# Patient Record
Sex: Female | Born: 1990 | Hispanic: No | Marital: Married | Smoking: Never smoker
Health system: Southern US, Community
[De-identification: ages and names within clinical notes are randomized; demographics above are authoritative.]

## PROBLEM LIST (undated history)

## (undated) DIAGNOSIS — M1990 Unspecified osteoarthritis, unspecified site: Secondary | ICD-10-CM

## (undated) DIAGNOSIS — D649 Anemia, unspecified: Secondary | ICD-10-CM

## (undated) DIAGNOSIS — E559 Vitamin D deficiency, unspecified: Secondary | ICD-10-CM

## (undated) HISTORY — PX: NO PAST SURGERIES: SHX2092

## (undated) HISTORY — DX: Vitamin D deficiency, unspecified: E55.9

---

## 2017-12-02 NOTE — L&D Delivery Note (Addendum)
Patient: Ashley Robles MRN: [id number]  GBS status: Negative  Patient is a 27 y.o. now G1P1 s/p NSVD at [redacted]w[redacted]d, who was admitted for SOL. SROM 19h 23m prior to delivery with clear fluid.    Delivery Note At 2:50 AM a viable female was delivered via Vaginal, Vacuum (Extractor) (Presentation: OA ).  APGAR: 7, 8; weight  pending.   Placenta status: spontaneous, intact with trailing membranes removed with ring forceps.  Cord: 3 vessel with the following complications: none.    Dilation complete at 22:30. Patient pushed for over 2 hours with breaks. Due to maternal fatigue and persistent fetal tachycardia with periods of fair variability and no reactivity, Dr. Alysia Penna was consulted for VAVD. Patient counseled on risks and benefits of use of vacuum extractor for delivery. Kiwi was applied. Head delivered OA with two ctx and 1 pop off. Median episiotomy cut to assist with delivery. No nuchal cord present. Shoulder and body delivered in usual fashion. Infant with spontaneous cry, placed on mother's abdomen, dried and bulb suctioned. Cord clamped x 2 after 1-minute delay, and cut by family member. Infant handed over to NICU team for evaluation. Cord blood drawn. Placenta delivered spontaneously with gentle cord traction. Trailing membranes removed with ring forcep. Fundus firm with massage and Pitocin. Perineum inspected and found to have 2nd degree laceration, which was repaired with 3.0 vicryl and 3.0 monocryl with good hemostasis achieved.  Anesthesia:  Epidural, Lidocaine 25 cc for repair  Episiotomy: Median Lacerations:  2nd degree perineal  Suture Repair: 3.0 vicryl 3.0 monocryl  Est. Blood Loss (mL): 350  Mom to postpartum.  Baby to Couplet care / Skin to Skin.  De Hollingshead 08/09/2018, 3:38 AM  OB Attending I was present and in the room during the delivery and repair. As noted above.  Nettie Elm, MD

## 2018-01-27 ENCOUNTER — Encounter: Payer: Self-pay | Admitting: Certified Nurse Midwife

## 2018-01-27 ENCOUNTER — Ambulatory Visit (INDEPENDENT_AMBULATORY_CARE_PROVIDER_SITE_OTHER): Payer: Medicaid Other | Admitting: Certified Nurse Midwife

## 2018-01-27 ENCOUNTER — Other Ambulatory Visit (HOSPITAL_COMMUNITY)
Admission: RE | Admit: 2018-01-27 | Discharge: 2018-01-27 | Disposition: A | Discharge status: Home / Self Care | Payer: Medicaid Other | Source: Ambulatory Visit | Attending: Certified Nurse Midwife | Admitting: Certified Nurse Midwife

## 2018-01-27 VITALS — BP 112/66 | HR 87 | Ht 63.0 in | Wt 125.0 lb

## 2018-01-27 DIAGNOSIS — O219 Vomiting of pregnancy, unspecified: Secondary | ICD-10-CM

## 2018-01-27 DIAGNOSIS — Z3401 Encounter for supervision of normal first pregnancy, first trimester: Secondary | ICD-10-CM | POA: Diagnosis present

## 2018-01-27 DIAGNOSIS — Z3400 Encounter for supervision of normal first pregnancy, unspecified trimester: Secondary | ICD-10-CM | POA: Insufficient documentation

## 2018-01-27 MED ORDER — DOXYLAMINE-PYRIDOXINE 10-10 MG PO TBEC: DELAYED_RELEASE_TABLET | ORAL | 4 refills | Status: AC

## 2018-01-27 MED ORDER — VITAFOL-NANO 18-0.6-0.4 MG PO TABS: 1.0000 | ORAL_TABLET | Freq: Every day | ORAL | 12 refills | Status: AC

## 2018-01-27 NOTE — Progress Notes (Signed)
Subjective:   Ashley Robles is a 27 y.o. G1P0 at [redacted]w[redacted]d by LMP being seen today for her first obstetrical visit.  Her obstetrical history is significant for none; here from [place] since November with spouse. Patient does intend to breast feed. Pregnancy history fully reviewed.  Patient reports nausea, no bleeding, no contractions, no cramping and no leaking.  HISTORY: Obstetric History   G1   P0   T0   P0   A0   L0    SAB0   TAB0   Ectopic0   Multiple0   Live Births0     # Outcome Date GA Lbr Len/2nd Weight Sex Delivery Anes PTL Lv  1 Current               Last pap smear was done unknown, patient denies any past pap smears.   Past Medical History:  Diagnosis Date  . Vitamin D deficiency    History reviewed. No pertinent surgical history. Family History  Problem Relation Age of Onset  . Diabetes Mother   . Thyroid disease Mother   . Diabetes Father   . Alzheimer's disease Maternal Grandfather    Social History   Tobacco Use  . Smoking status: Never Smoker  . Smokeless tobacco: Never Used  Substance Use Topics  . Alcohol use: No    Frequency: Never  . Drug use: No   Not on File Current Outpatient Medications on File Prior to Visit  Medication Sig Dispense Refill  . ondansetron (ZOFRAN) 4 MG tablet Take 4 mg by mouth every 8 (eight) hours as needed for nausea or vomiting.    . prenatal vitamin w/FE, FA (PRENATAL 1 + 1) 27-1 MG TABS tablet Take 1 tablet by mouth daily at 12 noon.     No current facility-administered medications on file prior to visit.     Review of Systems Pertinent items noted in HPI and remainder of comprehensive ROS otherwise negative.  Exam   Vitals:   01/27/18 1037 01/27/18 1041  BP: 112/66   Pulse: 87   Weight: 125 lb (56.7 kg)   Height:  5\' 3"  (1.6 m)      Uterus:     Pelvic Exam: Perineum: no hemorrhoids, normal perineum   Vulva: normal external genitalia, no lesions   Vagina:  normal mucosa, normal discharge   Cervix: no lesions and normal, pap smear done.    Adnexa: normal adnexa and no mass, fullness, tenderness   Bony Pelvis: average  System: General: well-developed, well-nourished female in no acute distress   Breast:  normal appearance, no masses or tenderness   Skin: normal coloration and turgor, no rashes   Neurologic: oriented, normal, negative, normal mood   Extremities: normal strength, tone, and muscle mass, ROM of all joints is normal   HEENT PERRLA, extraocular movement intact and sclera clear, anicteric   Mouth/Teeth mucous membranes moist, pharynx normal without lesions and dental hygiene good   Neck supple and no masses   Cardiovascular: regular rate and rhythm   Respiratory:  no respiratory distress, normal breath sounds   Abdomen: soft, non-tender; bowel sounds normal; no masses,  no organomegaly     Assessment:   Pregnancy: G1P0 Patient Active Problem List   Diagnosis Date Noted  . Encounter for supervision of normal first pregnancy in first trimester 01/27/2018  . Nausea/vomiting in pregnancy 01/27/2018     Plan:  1. Encounter for supervision of normal first pregnancy in first trimester    -  Obstetric Panel, Including HIV - VITAMIN D 25 Hydroxy (Vit-D Deficiency, Fractures) - Culture, OB Urine - Hemoglobin A1c - Cytology - PAP - Cervicovaginal ancillary only - Inheritest Core(CF97,SMA,FraX) - Enroll Patient in Babyscripts - Genetic Screening - Prenatal-Fe Fum-Methf-FA w/o A (VITAFOL-NANO) 18-0.6-0.4 MG TABS; Take 1 tablet by mouth daily.  Dispense: 30 tablet; Refill: 12  2. Nausea/vomiting in pregnancy    - Doxylamine-Pyridoxine (DICLEGIS) 10-10 MG TBEC; Take 1 tablet with breakfast and lunch.  Take 2 tablets at bedtime.  Dispense: 100 tablet; Refill: 4   Initial labs drawn. Continue prenatal vitamins. Genetic Screening discussed, NIPS: ordered. Ultrasound discussed; fetal anatomic survey: ordered. Problem list reviewed and updated. The nature of [facility] with multiple MDs and other Advanced Practice Providers was explained to patient; also emphasized that residents, students are part of our team. Routine obstetric precautions reviewed. Return in about 4 weeks (around 02/24/2018) for ROB.     Orvilla Cornwallachelle Deunta Beneke, CNM [facility]

## 2018-01-27 NOTE — Progress Notes (Signed)
Pt complaints of constipation.   Pt is currently taking Zofran as needed for N&V- doing well.

## 2018-01-28 LAB — OBSTETRIC PANEL, INCLUDING HIV
Antibody Screen: NEGATIVE
BASOS ABS: 0 10*3/uL (ref 0.0–0.2)
Basos: 1 %
EOS (ABSOLUTE): 0.3 10*3/uL (ref 0.0–0.4)
Eos: 6 %
HEMATOCRIT: 31.9 % — AB (ref 34.0–46.6)
HIV Screen 4th Generation wRfx: NONREACTIVE
Hemoglobin: 10.8 g/dL — ABNORMAL LOW (ref 11.1–15.9)
Hepatitis B Surface Ag: NEGATIVE
IMMATURE GRANS (ABS): 0 10*3/uL (ref 0.0–0.1)
Immature Granulocytes: 0 %
LYMPHS: 29 %
Lymphocytes Absolute: 1.7 10*3/uL (ref 0.7–3.1)
MCH: 28.4 pg (ref 26.6–33.0)
MCHC: 33.9 g/dL (ref 31.5–35.7)
MCV: 84 fL (ref 79–97)
MONOCYTES: 8 %
Monocytes Absolute: 0.5 10*3/uL (ref 0.1–0.9)
NEUTROS PCT: 56 %
Neutrophils Absolute: 3.2 10*3/uL (ref 1.4–7.0)
PLATELETS: 262 10*3/uL (ref 150–379)
RBC: 3.8 x10E6/uL (ref 3.77–5.28)
RDW: 15.1 % (ref 12.3–15.4)
RPR Ser Ql: NONREACTIVE
RUBELLA: 2.93 {index} (ref >0.99)
Rh Factor: POSITIVE
WBC: 5.6 10*3/uL (ref 3.4–10.8)

## 2018-01-28 LAB — CERVICOVAGINAL ANCILLARY ONLY
BACTERIAL VAGINITIS: NEGATIVE
CANDIDA VAGINITIS: POSITIVE — AB
Chlamydia: NEGATIVE
Neisseria Gonorrhea: NEGATIVE
Trichomonas: NEGATIVE

## 2018-01-28 LAB — VITAMIN D 25 HYDROXY (VIT D DEFICIENCY, FRACTURES): VIT D 25 HYDROXY: 31.2 ng/mL (ref 30.0–100.0)

## 2018-01-28 LAB — HEMOGLOBIN A1C
Est. average glucose Bld gHb Est-mCnc: 103 mg/dL
HEMOGLOBIN A1C: 5.2 % (ref 4.8–5.6)

## 2018-01-29 ENCOUNTER — Telehealth: Payer: Self-pay

## 2018-01-29 ENCOUNTER — Other Ambulatory Visit: Payer: Self-pay | Admitting: Certified Nurse Midwife

## 2018-01-29 DIAGNOSIS — B3731 Acute candidiasis of vulva and vagina: Secondary | ICD-10-CM

## 2018-01-29 DIAGNOSIS — B373 Candidiasis of vulva and vagina: Secondary | ICD-10-CM

## 2018-01-29 DIAGNOSIS — O99011 Anemia complicating pregnancy, first trimester: Secondary | ICD-10-CM

## 2018-01-29 LAB — CYTOLOGY - PAP
Adequacy: ABSENT
Diagnosis: NEGATIVE

## 2018-01-29 LAB — CULTURE, OB URINE

## 2018-01-29 LAB — URINE CULTURE, OB REFLEX

## 2018-01-29 MED ORDER — CITRANATAL BLOOM 90-1 MG PO TABS: 1.0000 | ORAL_TABLET | Freq: Every day | ORAL | 12 refills | Status: DC

## 2018-01-29 MED ORDER — TERCONAZOLE 0.8 % VA CREA: 1.0000 | TOPICAL_CREAM | Freq: Every day | VAGINAL | 0 refills | Status: DC

## 2018-01-29 NOTE — Telephone Encounter (Signed)
Left VM message to call office.

## 2018-01-29 NOTE — Telephone Encounter (Signed)
Patient notified of results and Rx. She verbalized understanding.

## 2018-01-29 NOTE — Telephone Encounter (Signed)
-----   Message from Roe Coombsachelle A Denney, CNM sent at 01/29/2018 10:05 AM EST ----- Please let her know that she is anemic.  I have added bloom to her other PNV.  Thank you. R.Denney CNM  Please let her know that she has a yeast infection.  Terconazole vaginal cream was sent to the pharmacy for her to use.  Thank you.

## 2018-01-30 ENCOUNTER — Telehealth: Payer: Self-pay

## 2018-01-30 NOTE — Telephone Encounter (Signed)
PA#[id number]

## 2018-02-02 ENCOUNTER — Other Ambulatory Visit: Payer: Self-pay | Admitting: Certified Nurse Midwife

## 2018-02-02 DIAGNOSIS — Z3401 Encounter for supervision of normal first pregnancy, first trimester: Secondary | ICD-10-CM

## 2018-02-06 ENCOUNTER — Other Ambulatory Visit: Payer: Self-pay | Admitting: Certified Nurse Midwife

## 2018-02-06 DIAGNOSIS — Z3401 Encounter for supervision of normal first pregnancy, first trimester: Secondary | ICD-10-CM

## 2018-02-06 LAB — INHERITEST CORE(CF97,SMA,FRAX)

## 2018-02-10 ENCOUNTER — Other Ambulatory Visit: Payer: Self-pay | Admitting: Certified Nurse Midwife

## 2018-02-10 DIAGNOSIS — Z3401 Encounter for supervision of normal first pregnancy, first trimester: Secondary | ICD-10-CM

## 2018-02-24 ENCOUNTER — Ambulatory Visit (INDEPENDENT_AMBULATORY_CARE_PROVIDER_SITE_OTHER): Payer: Medicaid Other | Admitting: Certified Nurse Midwife

## 2018-02-24 ENCOUNTER — Encounter: Payer: Self-pay | Admitting: *Deleted

## 2018-02-24 VITALS — BP 95/60 | HR 82 | Wt 128.0 lb

## 2018-02-24 DIAGNOSIS — Z3401 Encounter for supervision of normal first pregnancy, first trimester: Secondary | ICD-10-CM

## 2018-02-24 NOTE — Progress Notes (Signed)
   PRENATAL VISIT NOTE  Subjective:  Ashley Robles is a 27 y.o. G1P0 at 1334w3d being seen today for ongoing prenatal care.  She is currently monitored for the following issues for this low-risk pregnancy and has Encounter for supervision of normal first pregnancy in first trimester and Nausea/vomiting in pregnancy on their problem list.  Patient reports headache, nausea, no bleeding, no contractions, no cramping and no leaking.  Contractions: Not present.  .   . Denies leaking of fluid.   The following portions of the patient's history were reviewed and updated as appropriate: allergies, current medications, past family history, past medical history, past social history, past surgical history and problem list. Problem list updated.  Objective:   Vitals:   02/24/18 1006  BP: 95/60  Pulse: 82  Weight: 128 lb (58.1 kg)    Fetal Status: Fetal Heart Rate (bpm): 150; doppler         General:  Alert, oriented and cooperative. Patient is in no acute distress.  Skin: Skin is warm and dry. No rash noted.   Right Breast Normal, no masses palpated, no nipple discharge  Cardiovascular: Normal heart rate noted  Respiratory: Normal respiratory effort, no problems with respiration noted  Abdomen: Soft, gravid, appropriate for gestational age.  Pain/Pressure: Absent     Pelvic: Cervical exam deferred        Extremities: Normal range of motion.     Mental Status:  Normal mood and affect. Normal behavior. Normal judgment and thought content.   Assessment and Plan:  Pregnancy: G1P0 at 4234w3d  1. Encounter for supervision of normal first pregnancy in first trimester     Nausea has improved.  Has not tried OTC Tylenol for her HA.  Patient told to go get a good bra fitting, current bra is not the correct size.   - US MFM OB COMP + 14 WK; Future  Preterm labor symptoms and general obstetric precautions including but not limited to vaginal bleeding, contractions, leaking of fluid and fetal movement were  reviewed in detail with the patient. Please refer to After Visit Summary for other counseling recommendations.  Return in about 1 month (around 03/24/2018) for ROB, MSAFP.   Roe Coombsachelle A Doreena Maulden, CNM

## 2018-03-27 ENCOUNTER — Other Ambulatory Visit: Payer: Self-pay | Admitting: Certified Nurse Midwife

## 2018-03-27 ENCOUNTER — Ambulatory Visit (HOSPITAL_COMMUNITY)
Admission: RE | Admit: 2018-03-27 | Discharge: 2018-03-27 | Disposition: A | Discharge status: Home / Self Care | Payer: Medicaid Other | Source: Ambulatory Visit | Attending: Certified Nurse Midwife | Admitting: Certified Nurse Midwife

## 2018-03-27 DIAGNOSIS — Z3A18 18 weeks gestation of pregnancy: Secondary | ICD-10-CM

## 2018-03-27 DIAGNOSIS — Z3401 Encounter for supervision of normal first pregnancy, first trimester: Secondary | ICD-10-CM

## 2018-03-27 DIAGNOSIS — Z36 Encounter for antenatal screening for chromosomal anomalies: Secondary | ICD-10-CM | POA: Insufficient documentation

## 2018-03-27 DIAGNOSIS — Z369 Encounter for antenatal screening, unspecified: Secondary | ICD-10-CM

## 2018-03-27 IMAGING — US US MFM OB COMP +14 WKS
1 series · 14 of 28 positions shown · non-contrast
Comparison: none

[Series 1: us mfm ob comp +14 wks · 73 acquisitions, 14 frames shown]
[im 3/73]
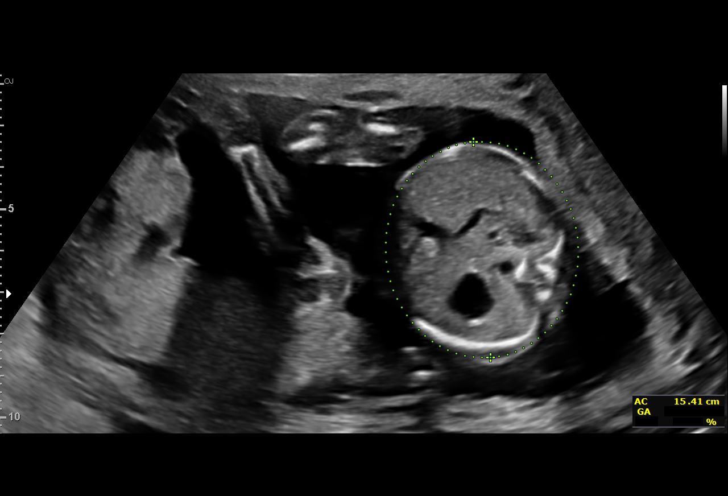
[im 9/73]
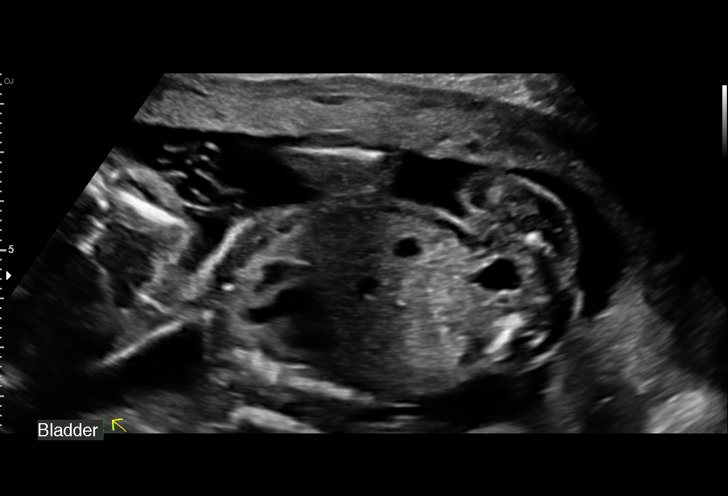
[im 14/73]
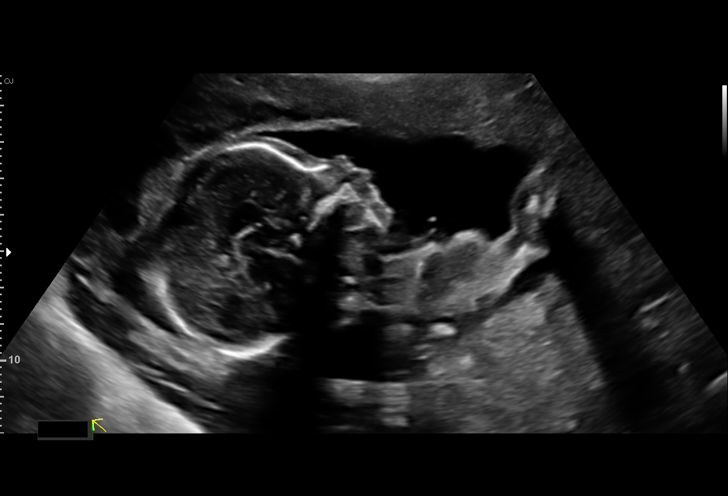
[im 19/73]
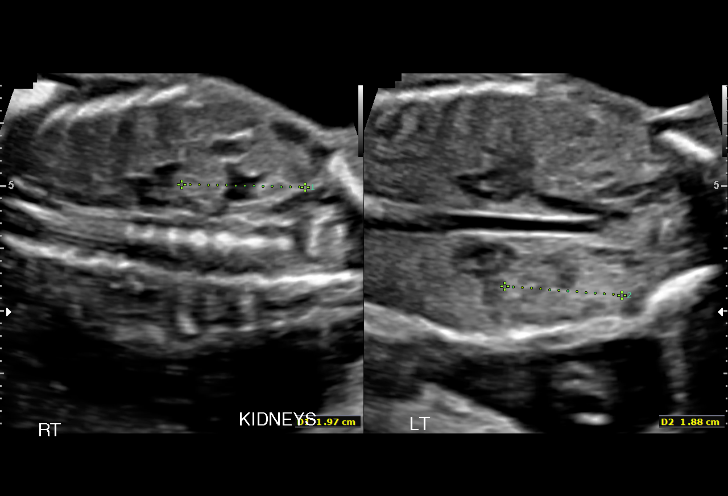
[im 25/73]
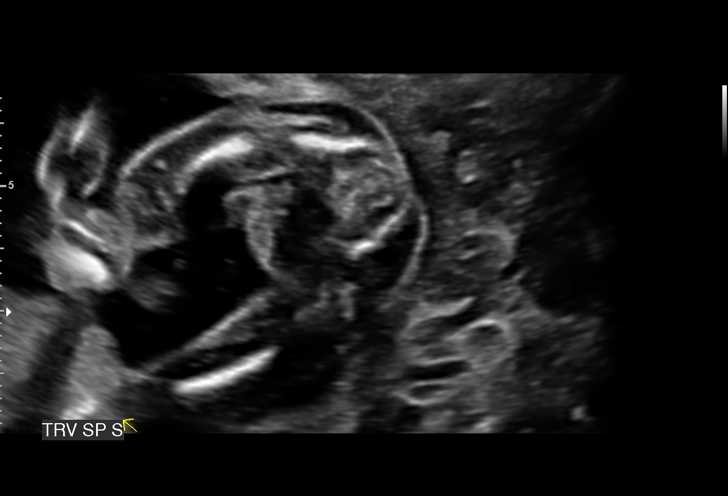
[im 30/73]
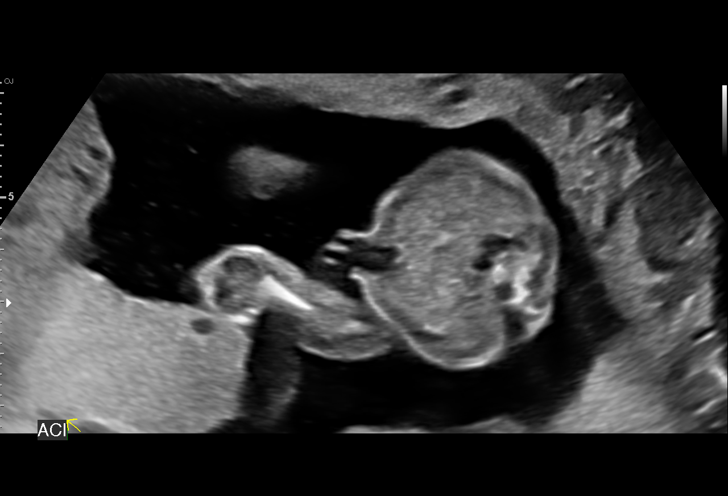
[im 35/73]
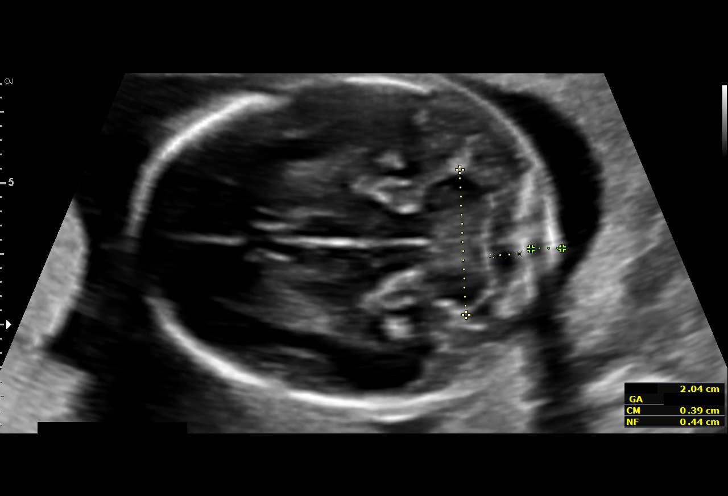
[im 41/73]
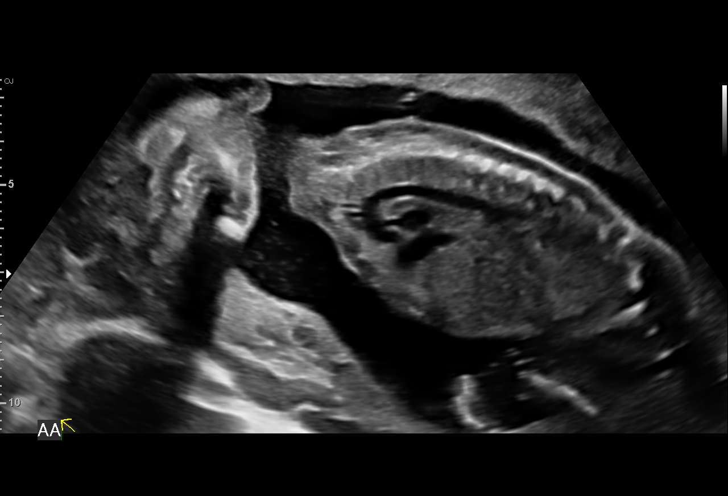
[im 46/73]
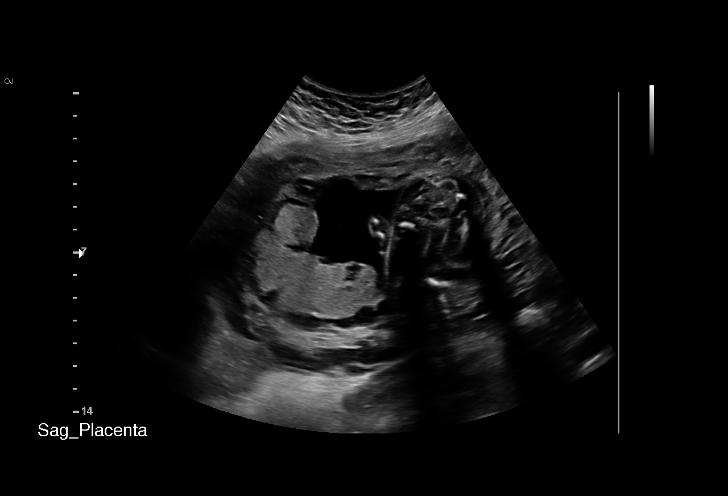
[im 51/73]
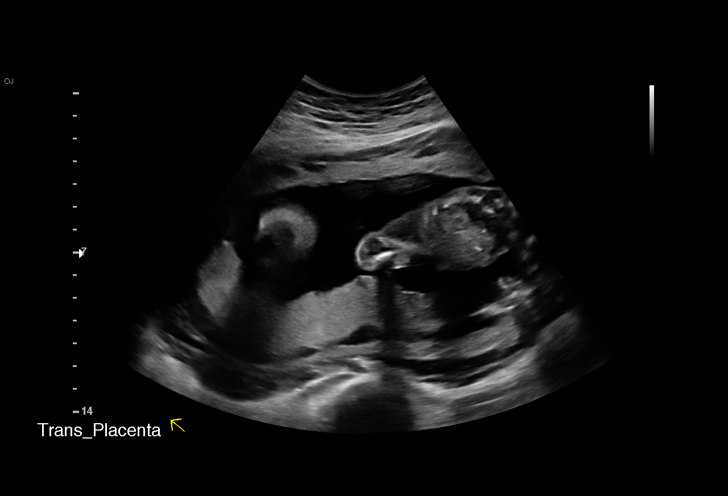
[im 57/73]
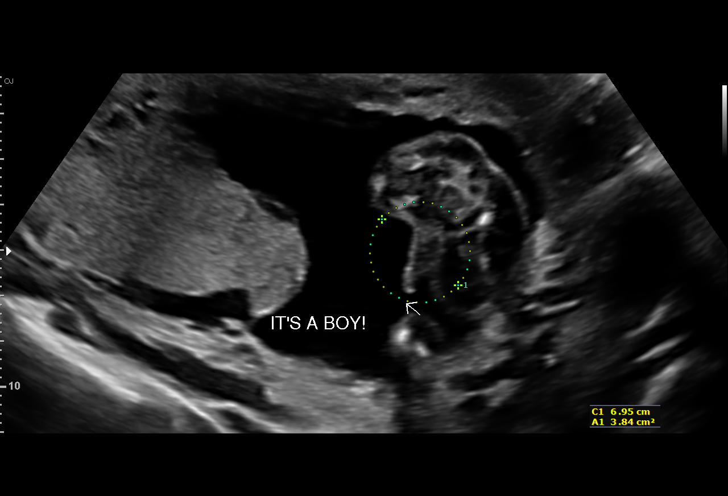
[im 62/73]
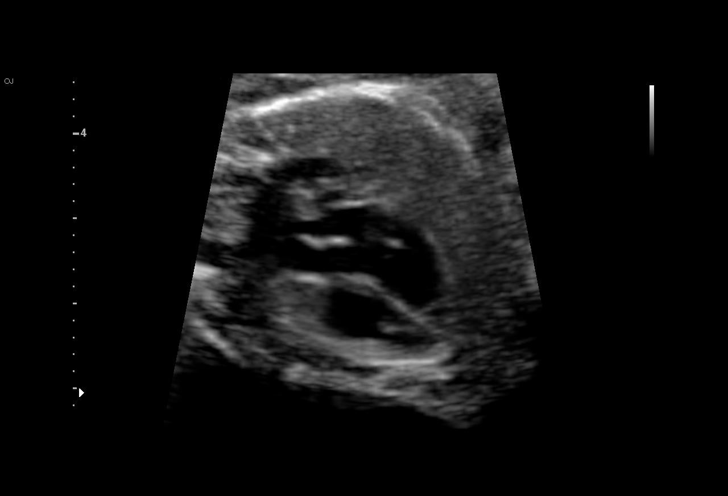
[im 67/73]
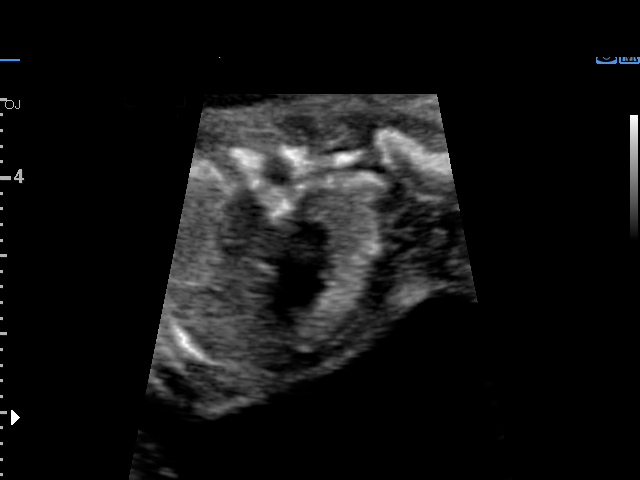
[im 73/73]
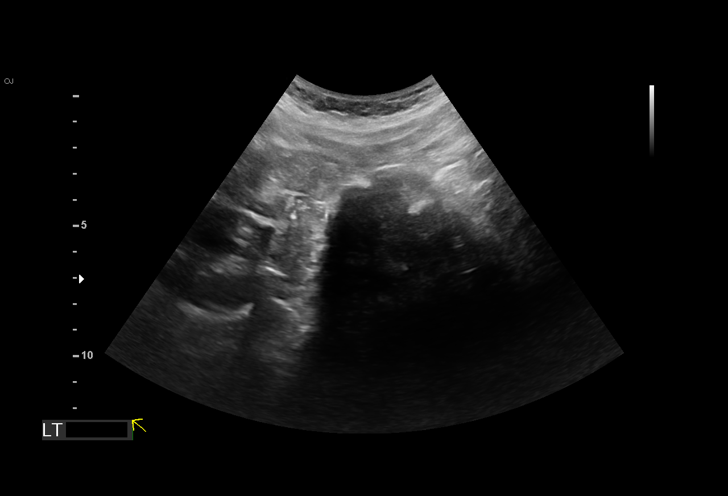

[14 of 28 positions shown; findings below may reference images not displayed]

Road [HOSPITAL]

Indications

18 weeks gestation of pregnancy
Encounter for antenatal screening for          [VM]
chromosomal anomalies
OB History

Blood Type:            Height:  5'3"   Weight (lb):  128       BMI:
Gravidity:    1         Term:   0        Prem:   0        SAB:   0
TOP:          0       Ectopic:  0        Living: 0
Fetal Evaluation

Num Of Fetuses:     1
Fetal Heart         124
Rate(bpm):
Cardiac Activity:   Observed
Presentation:       Transverse, head to maternal right
Placenta:           Posterior, above cervical os
P. Cord Insertion:  Visualized

Amniotic Fluid
AFI FV:      Subjectively within normal limits

Largest Pocket(cm)
4.0
Biometry

BPD:      46.8  mm     G. Age:  20w 1d         93  %    CI:        75.73   %    70 - 86
FL/HC:      17.7   %    16.1 -
HC:      170.5  mm     G. Age:  19w 5d         79  %    HC/AC:      1.10        1.09 -
AC:      154.9  mm     G. Age:  20w 5d         92  %    FL/BPD:     64.5   %
FL:       30.2  mm     G. Age:  19w 3d         60  %    FL/AC:      19.5   %    20 - 24
HUM:      29.2  mm     G. Age:  19w 4d         68  %
CER:      20.4  mm     G. Age:  19w 3d         64  %
NFT:       4.4  mm

CM:        3.9  mm

Est. FW:     327  gm    0 lb 12 oz      62  %
Gestational Age

LMP:           18w 6d        Date:  [DATE]                 EDD:   [DATE]
U/S Today:     20w 0d                                        EDD:   [DATE]
Best:          18w 6d     Det. By:  LMP  ([DATE])          EDD:   [DATE]
Anatomy

Cranium:               Appears normal         Aortic Arch:            Appears normal
Cavum:                 Appears normal         Ductal Arch:            Appears normal
Ventricles:            Appears normal         Diaphragm:              Appears normal
Choroid Plexus:        Appears normal         Stomach:                Appears normal, left
sided
Cerebellum:            Appears normal         Abdomen:                Appears normal
Posterior Fossa:       Appears normal         Abdominal Wall:         Appears nml (cord
insert, abd wall)
Nuchal Fold:           Appears normal         Cord Vessels:           Appears normal (3
vessel cord)
Face:                  Appears normal         Kidneys:                Appear normal
(orbits and profile)
Lips:                  Appears normal         Bladder:                Appears normal
Thoracic:              Appears normal         Spine:                  Appears normal
Heart:                 Appears normal         Upper Extremities:      Appears normal
(4CH, axis, and
situs)
RVOT:                  Appears normal         Lower Extremities:      Appears normal
LVOT:                  Appears normal

Other:  Male gender. Heels visualized. Technically difficult due to fetal
position.
Cervix Uterus Adnexa

Cervix
Length:            3.3  cm.
Normal appearance by transabdominal scan.

Uterus
No abnormality visualized.

Left Ovary
Not visualized. No adnexal mass visualized.

Right Ovary
Not visualized. No adnexal mass visualized.

Cul De Sac:   No free fluid seen.

Adnexa:       No adnexal mass visualized.
Impression

SIUP at 18+6 weeks with cardiac activity
Normal detailed fetal anatomy
Markers of aneuploidy: none
Normal amniotic fluid volume
Measurements consistent with LMP dating
Recommendations

Follow-up as clinically indicated

## 2018-03-30 ENCOUNTER — Other Ambulatory Visit: Payer: Self-pay | Admitting: Certified Nurse Midwife

## 2018-03-30 DIAGNOSIS — Z3401 Encounter for supervision of normal first pregnancy, first trimester: Secondary | ICD-10-CM

## 2018-04-06 ENCOUNTER — Encounter: Payer: Self-pay | Admitting: Certified Nurse Midwife

## 2018-04-06 ENCOUNTER — Ambulatory Visit (INDEPENDENT_AMBULATORY_CARE_PROVIDER_SITE_OTHER): Payer: Medicaid Other | Admitting: Certified Nurse Midwife

## 2018-04-06 ENCOUNTER — Other Ambulatory Visit: Payer: Self-pay

## 2018-04-06 ENCOUNTER — Telehealth: Payer: Self-pay

## 2018-04-06 VITALS — BP 104/69 | HR 94 | Wt 137.8 lb

## 2018-04-06 DIAGNOSIS — K219 Gastro-esophageal reflux disease without esophagitis: Secondary | ICD-10-CM

## 2018-04-06 DIAGNOSIS — O99612 Diseases of the digestive system complicating pregnancy, second trimester: Secondary | ICD-10-CM

## 2018-04-06 DIAGNOSIS — Z3401 Encounter for supervision of normal first pregnancy, first trimester: Secondary | ICD-10-CM

## 2018-04-06 MED ORDER — OMEPRAZOLE 20 MG PO CPDR: 20.0000 mg | DELAYED_RELEASE_CAPSULE | Freq: Two times a day (BID) | ORAL | 5 refills | Status: AC

## 2018-04-06 NOTE — Progress Notes (Signed)
C/o lower back pain- left quadrant 5-8/10 x 2 weeks, usually after eating/feeling full.

## 2018-04-06 NOTE — Telephone Encounter (Signed)
Returned call, pt had questions about rx.

## 2018-04-06 NOTE — Progress Notes (Signed)
   PRENATAL VISIT NOTE  Subjective:  Ashley Robles is a 27 y.o. G1P0 at [redacted]w[redacted]d being seen today for ongoing prenatal care.  She is currently monitored for the following issues for this low-risk pregnancy and has Encounter for supervision of normal first pregnancy in first trimester and Nausea/vomiting in pregnancy on their problem list.  Patient reports heartburn, no bleeding, no contractions, no cramping and no leaking.  Contractions: Not present. Vag. Bleeding: None.  Movement: Present. Denies leaking of fluid.   The following portions of the patient's history were reviewed and updated as appropriate: allergies, current medications, past family history, past medical history, past social history, past surgical history and problem list. Problem list updated.  Objective:   Vitals:   04/06/18 1015  BP: 104/69  Pulse: 94  Weight: 137 lb 12.8 oz (62.5 kg)    Fetal Status: Fetal Heart Rate (bpm): 145; doppler Fundal Height: 20 cm Movement: Present     General:  Alert, oriented and cooperative. Patient is in no acute distress.  Skin: Skin is warm and dry. No rash noted.   Cardiovascular: Normal heart rate noted  Respiratory: Normal respiratory effort, no problems with respiration noted  Abdomen: Soft, gravid, appropriate for gestational age.  Pain/Pressure: Present     Pelvic: Cervical exam deferred        Extremities: Normal range of motion.  Edema: None  Mental Status: Normal mood and affect. Normal behavior. Normal judgment and thought content.   Assessment and Plan:  Pregnancy: G1P0 at [redacted]w[redacted]d  1. Encounter for supervision of normal first pregnancy in first trimester      Anatomy scan WNL - AFP, Serum, Open Spina Bifida  2. Gastroesophageal reflux during pregnancy in second trimester, antepartum     Reflux symptoms of upper left quadrant pain after eating.  OTC TUMS - omeprazole (PRILOSEC) 20 MG capsule; Take 1 capsule (20 mg total) by mouth 2 (two) times daily before a meal.   Dispense: 60 capsule; Refill: 5  Preterm labor symptoms and general obstetric precautions including but not limited to vaginal bleeding, contractions, leaking of fluid and fetal movement were reviewed in detail with the patient. Please refer to After Visit Summary for other counseling recommendations.  Return in about 1 month (around 05/04/2018) for ROB.  Future Appointments  Date Time Provider Department Center  05/04/2018  9:45 AM Roe Coombs, CNM [facility] None    Roe Coombs, CNM

## 2018-04-09 ENCOUNTER — Other Ambulatory Visit: Payer: Self-pay | Admitting: Certified Nurse Midwife

## 2018-04-09 DIAGNOSIS — Z3401 Encounter for supervision of normal first pregnancy, first trimester: Secondary | ICD-10-CM

## 2018-04-09 LAB — AFP, SERUM, OPEN SPINA BIFIDA
AFP MoM: 1.3
AFP Value: 76 ng/mL
GEST. AGE ON COLLECTION DATE: 20 wk
Maternal Age At EDD: 29.4 yr
OSBR Risk 1 IN: 4803
Test Results:: NEGATIVE
WEIGHT: 137 [lb_av]

## 2018-04-16 ENCOUNTER — Encounter: Payer: Self-pay | Admitting: *Deleted

## 2018-05-04 ENCOUNTER — Encounter: Payer: Medicaid Other | Admitting: Certified Nurse Midwife

## 2018-05-08 ENCOUNTER — Encounter: Payer: Self-pay | Admitting: Certified Nurse Midwife

## 2018-05-08 ENCOUNTER — Other Ambulatory Visit (HOSPITAL_COMMUNITY)
Admission: RE | Admit: 2018-05-08 | Discharge: 2018-05-08 | Disposition: A | Discharge status: Home / Self Care | Payer: Medicaid Other | Source: Ambulatory Visit | Attending: Certified Nurse Midwife | Admitting: Certified Nurse Midwife

## 2018-05-08 ENCOUNTER — Ambulatory Visit (INDEPENDENT_AMBULATORY_CARE_PROVIDER_SITE_OTHER): Payer: Medicaid Other | Admitting: Certified Nurse Midwife

## 2018-05-08 VITALS — BP 107/69 | HR 92 | Wt 143.5 lb

## 2018-05-08 DIAGNOSIS — Z3A24 24 weeks gestation of pregnancy: Secondary | ICD-10-CM | POA: Insufficient documentation

## 2018-05-08 DIAGNOSIS — Z3402 Encounter for supervision of normal first pregnancy, second trimester: Secondary | ICD-10-CM

## 2018-05-08 DIAGNOSIS — O26892 Other specified pregnancy related conditions, second trimester: Secondary | ICD-10-CM | POA: Diagnosis not present

## 2018-05-08 DIAGNOSIS — N898 Other specified noninflammatory disorders of vagina: Secondary | ICD-10-CM | POA: Diagnosis not present

## 2018-05-08 NOTE — Progress Notes (Signed)
   PRENATAL VISIT NOTE  Subjective:  Ashley Robles is a 27 y.o. G1P0 at 6636w6d being seen today for ongoing prenatal care.  She is currently monitored for the following issues for this low-risk pregnancy and has Encounter for supervision of normal first pregnancy in first trimester and Nausea/vomiting in pregnancy on their problem list.  Patient reports no bleeding, no contractions, no cramping, no leaking, vaginal irritation and reports increased vaginal discharge.  Contractions: Not present. Vag. Bleeding: None.  Movement: Present. Denies leaking of fluid.   The following portions of the patient's history were reviewed and updated as appropriate: allergies, current medications, past family history, past medical history, past social history, past surgical history and problem list. Problem list updated.  Objective:   Vitals:   05/08/18 1045  BP: 107/69  Pulse: 92  Weight: 143 lb 8 oz (65.1 kg)    Fetal Status: Fetal Heart Rate (bpm): 142; doppler Fundal Height: 25 cm Movement: Present  Presentation: Vertex  General:  Alert, oriented and cooperative. Patient is in no acute distress.  Skin: Skin is warm and dry. No rash noted.   Cardiovascular: Normal heart rate noted  Respiratory: Normal respiratory effort, no problems with respiration noted  Abdomen: Soft, gravid, appropriate for gestational age.  Pain/Pressure: Present     Pelvic: Cervical exam performed Dilation: Closed Effacement (%): Thick    Extremities: Normal range of motion.  Edema: Trace  Mental Status: Normal mood and affect. Normal behavior. Normal judgment and thought content.   Assessment and Plan:  Pregnancy: G1P0 at 7736w6d  1. Encounter for supervision of normal first pregnancy in second trimester     Doing well.   2. Vaginal discharge during pregnancy in second trimester      Swab sent.  - Cervicovaginal ancillary only  Preterm labor symptoms and general obstetric precautions including but not limited to vaginal  bleeding, contractions, leaking of fluid and fetal movement were reviewed in detail with the patient. Please refer to After Visit Summary for other counseling recommendations.  Return in about 3 weeks (around 05/29/2018) for ROB, 2 hr OGTT.  Future Appointments  Date Time Provider Department Center  05/28/2018  9:30 AM [facility] None    Roe Coombsachelle A Denney, CNM

## 2018-05-08 NOTE — Patient Instructions (Signed)
Glucose Tolerance Test During Pregnancy The glucose tolerance test (GTT) is a blood test used to determine if you have developed a type of diabetes during pregnancy (gestational diabetes). This is when your body does not properly process sugar (glucose) in the food you eat, resulting in high blood glucose levels. Typically, a GTT is done after you have had a 1-hour glucose test with results that indicate you possibly have gestational diabetes. It may also be done if:  You have a history of giving birth to very large babies or have experienced repeated fetal loss (stillbirth).  You have signs and symptoms of diabetes, such as: ? Changes in your vision. ? Tingling or numbness in your hands or feet. ? Changes in hunger, thirst, and urination not otherwise explained by your pregnancy.  The GTT lasts about 3 hours. You will be given a sugar-water solution to drink at the beginning of the test. You will have blood drawn before you drink the solution and then again 1, 2, and 3 hours after you drink it. You will not be allowed to eat or drink anything else during the test. You must remain at the testing location to make sure that your blood is drawn on time. You should also avoid exercising during the test, because exercise can alter test results. How do I prepare for this test? Eat normally for 3 days prior to the GTT test, including having plenty of carbohydrate-rich foods. Do not eat or drink anything except water during the final 12 hours before the test. In addition, your health care provider may ask you to stop taking certain medicines before the test. What do the results mean? It is your responsibility to obtain your test results. Ask the lab or department performing the test when and how you will get your results. Contact your health care provider to discuss any questions you have about your results. Range of Normal Values Ranges for normal values may vary among different labs and hospitals. You  should always check with your health care provider after having lab work or other tests done to discuss whether your values are considered within normal limits. Normal levels of blood glucose are as follows:  Fasting: less than 105 mg/dL.  1 hour after drinking the solution: less than 190 mg/dL.  2 hours after drinking the solution: less than 165 mg/dL.  3 hours after drinking the solution: less than 145 mg/dL.  Some substances can interfere with GTT results. These may include:  Blood pressure and heart failure medicines, including beta blockers, furosemide, and thiazides.  Anti-inflammatory medicines, including aspirin.  Nicotine.  Some psychiatric medicines.  Meaning of Results Outside Normal Value Ranges GTT test results that are above normal values may indicate a number of health problems, such as:  Gestational diabetes.  Acute stress response.  Cushing syndrome.  Tumors such as pheochromocytoma or glucagonoma.  Long-term kidney problems.  Pancreatitis.  Hyperthyroidism.  Current infection.  Discuss your test results with your health care provider. He or she will use the results to make a diagnosis and determine a treatment plan that is right for you. This information is not intended to replace advice given to you by your health care provider. Make sure you discuss any questions you have with your health care provider. Document Released: 05/19/2012 Document Revised: 04/25/2016 Document Reviewed: 03/25/2014 Elsevier Interactive Patient Education  2018 [employer]  Second Trimester of Pregnancy The second trimester is from week 13 through week 28, month 4 through 6. This is  often the time in pregnancy that you feel your best. Often times, morning sickness has lessened or quit. You may have more energy, and you may get hungry more often. Your unborn baby (fetus) is growing rapidly. At the end of the sixth month, he or she is about 9 inches long and weighs about 1  pounds. You will likely feel the baby move (quickening) between 18 and 20 weeks of pregnancy. Follow these instructions at home:  Avoid all smoking, herbs, and alcohol. Avoid drugs not approved by your doctor.  Do not use any tobacco products, including cigarettes, chewing tobacco, and electronic cigarettes. If you need help quitting, ask your doctor. You may get counseling or other support to help you quit.  Only take medicine as told by your doctor. Some medicines are safe and some are not during pregnancy.  Exercise only as told by your doctor. Stop exercising if you start having cramps.  Eat regular, healthy meals.  Wear a good support bra if your breasts are tender.  Do not use hot tubs, steam rooms, or saunas.  Wear your seat belt when driving.  Avoid raw meat, uncooked cheese, and liter boxes and soil used by cats.  Take your prenatal vitamins.  Take 1500-2000 milligrams of calcium daily starting at the 20th week of pregnancy until you deliver your baby.  Try taking medicine that helps you poop (stool softener) as needed, and if your doctor approves. Eat more fiber by eating fresh fruit, vegetables, and whole grains. Drink enough fluids to keep your pee (urine) clear or pale yellow.  Take warm water baths (sitz baths) to soothe pain or discomfort caused by hemorrhoids. Use hemorrhoid cream if your doctor approves.  If you have puffy, bulging veins (varicose veins), wear support hose. Raise (elevate) your feet for 15 minutes, 3-4 times a day. Limit salt in your diet.  Avoid heavy lifting, wear low heals, and sit up straight.  Rest with your legs raised if you have leg cramps or low back pain.  Visit your dentist if you have not gone during your pregnancy. Use a soft toothbrush to brush your teeth. Be gentle when you floss.  You can have sex (intercourse) unless your doctor tells you not to.  Go to your doctor visits. Get help if:  You feel dizzy.  You have mild  cramps or pressure in your lower belly (abdomen).  You have a nagging pain in your belly area.  You continue to feel sick to your stomach (nauseous), throw up (vomit), or have watery poop (diarrhea).  You have bad smelling fluid coming from your vagina.  You have pain with peeing (urination). Get help right away if:  You have a fever.  You are leaking fluid from your vagina.  You have spotting or bleeding from your vagina.  You have severe belly cramping or pain.  You lose or gain weight rapidly.  You have trouble catching your breath and have chest pain.  You notice sudden or extreme puffiness (swelling) of your face, hands, ankles, feet, or legs.  You have not felt the baby move in over an hour.  You have severe headaches that do not go away with medicine.  You have vision changes. This information is not intended to replace advice given to you by your health care provider. Make sure you discuss any questions you have with your health care provider. Document Released: 02/12/2010 Document Revised: 04/25/2016 Document Reviewed: 01/19/2013 Elsevier Interactive Patient Education  2017 [employer]

## 2018-05-11 LAB — CERVICOVAGINAL ANCILLARY ONLY
Bacterial vaginitis: NEGATIVE
CANDIDA VAGINITIS: NEGATIVE

## 2018-05-28 ENCOUNTER — Ambulatory Visit (INDEPENDENT_AMBULATORY_CARE_PROVIDER_SITE_OTHER): Payer: Medicaid Other | Admitting: Certified Nurse Midwife

## 2018-05-28 ENCOUNTER — Other Ambulatory Visit: Payer: Medicaid Other

## 2018-05-28 VITALS — BP 103/69 | HR 85 | Wt 147.4 lb

## 2018-05-28 DIAGNOSIS — Z3402 Encounter for supervision of normal first pregnancy, second trimester: Secondary | ICD-10-CM

## 2018-05-28 NOTE — Progress Notes (Signed)
   PRENATAL VISIT NOTE  Subjective:  Ashley Robles is a 27 y.o. G1P0 at 6250w5d being seen today for ongoing prenatal care.  She is currently monitored for the following issues for this low-risk pregnancy and has Supervision of normal first pregnancy, antepartum and Nausea/vomiting in pregnancy on their problem list.  Patient reports no complaints.  Contractions: Not present. Vag. Bleeding: None.  Movement: Present. Denies leaking of fluid.   The following portions of the patient's history were reviewed and updated as appropriate: allergies, current medications, past family history, past medical history, past social history, past surgical history and problem list. Problem list updated.  Objective:   Vitals:   05/28/18 0958  BP: 103/69  Pulse: 85  Weight: 147 lb 6.4 oz (66.9 kg)    Fetal Status: Fetal Heart Rate (bpm): 150; doppler Fundal Height: 27 cm Movement: Present     General:  Alert, oriented and cooperative. Patient is in no acute distress.  Skin: Skin is warm and dry. No rash noted.   Cardiovascular: Normal heart rate noted  Respiratory: Normal respiratory effort, no problems with respiration noted  Abdomen: Soft, gravid, appropriate for gestational age.  Pain/Pressure: Present     Pelvic: Cervical exam deferred        Extremities: Normal range of motion.  Edema: None  Mental Status: Normal mood and affect. Normal behavior. Normal judgment and thought content.   Assessment and Plan:  Pregnancy: G1P0 at 1650w5d  1. Encounter for supervision of normal first pregnancy in second trimester     Doing well.  - Glucose Tolerance, 2 Hours w/1 Hour - CBC - RPR - HIV antibody  Preterm labor symptoms and general obstetric precautions including but not limited to vaginal bleeding, contractions, leaking of fluid and fetal movement were reviewed in detail with the patient. Please refer to After Visit Summary for other counseling recommendations.  Return in about 2 weeks (around  06/11/2018) for ROB.  No future appointments.  Roe Coombsachelle A Kenna Kirn, CNM

## 2018-05-29 LAB — CBC
Hematocrit: 34.7 % (ref 34.0–46.6)
Hemoglobin: 11.7 g/dL (ref 11.1–15.9)
MCH: 30.9 pg (ref 26.6–33.0)
MCHC: 33.7 g/dL (ref 31.5–35.7)
MCV: 92 fL (ref 79–97)
Platelets: 220 10*3/uL (ref 150–450)
RBC: 3.79 x10E6/uL (ref 3.77–5.28)
RDW: 13.9 % (ref 12.3–15.4)
WBC: 8.8 10*3/uL (ref 3.4–10.8)

## 2018-05-29 LAB — HIV ANTIBODY (ROUTINE TESTING W REFLEX): HIV Screen 4th Generation wRfx: NONREACTIVE

## 2018-05-29 LAB — GLUCOSE TOLERANCE, 2 HOURS W/ 1HR
GLUCOSE, 1 HOUR: 150 mg/dL (ref 65–179)
GLUCOSE, FASTING: 84 mg/dL (ref 65–91)
Glucose, 2 hour: 112 mg/dL (ref 65–152)

## 2018-05-29 LAB — RPR: RPR: NONREACTIVE

## 2018-06-09 ENCOUNTER — Other Ambulatory Visit: Payer: Self-pay | Admitting: Certified Nurse Midwife

## 2018-06-09 DIAGNOSIS — Z3400 Encounter for supervision of normal first pregnancy, unspecified trimester: Secondary | ICD-10-CM

## 2018-06-11 ENCOUNTER — Ambulatory Visit (INDEPENDENT_AMBULATORY_CARE_PROVIDER_SITE_OTHER): Payer: Medicaid Other | Admitting: Certified Nurse Midwife

## 2018-06-11 DIAGNOSIS — Z3400 Encounter for supervision of normal first pregnancy, unspecified trimester: Secondary | ICD-10-CM

## 2018-06-11 NOTE — Progress Notes (Signed)
Patient reports good fetal movement with occasional uterine irritability. 

## 2018-06-11 NOTE — Progress Notes (Signed)
   PRENATAL VISIT NOTE  Subjective:  Ashley Robles is a 27 y.o. G1P0 at 6755w5d being seen today for ongoing prenatal care.  She is currently monitored for the following issues for this low-risk pregnancy and has Supervision of normal first pregnancy, antepartum and Nausea/vomiting in pregnancy on their problem list.  Patient reports no complaints.  Contractions: Irritability. Vag. Bleeding: None.  Movement: Present. Denies leaking of fluid.   The following portions of the patient's history were reviewed and updated as appropriate: allergies, current medications, past family history, past medical history, past social history, past surgical history and problem list. Problem list updated.  Objective:   Vitals:   06/11/18 0949  BP: 109/71  Pulse: 82  Weight: 149 lb 9.6 oz (67.9 kg)    Fetal Status:     Movement: Present     General:  Alert, oriented and cooperative. Patient is in no acute distress.  Skin: Skin is warm and dry. No rash noted.   Cardiovascular: Normal heart rate noted  Respiratory: Normal respiratory effort, no problems with respiration noted  Abdomen: Soft, gravid, appropriate for gestational age.  Pain/Pressure: Present     Pelvic: Cervical exam deferred        Extremities: Normal range of motion.  Edema: None  Mental Status: Normal mood and affect. Normal behavior. Normal judgment and thought content.   Assessment and Plan:  Pregnancy: G1P0 at 3055w5d  1. Supervision of normal first pregnancy, antepartum     Doing well.    Preterm labor symptoms and general obstetric precautions including but not limited to vaginal bleeding, contractions, leaking of fluid and fetal movement were reviewed in detail with the patient. Please refer to After Visit Summary for other counseling recommendations.  Return in about 2 weeks (around 06/25/2018) for ROB.  No future appointments.  Roe Coombsachelle A Lakela Kuba, CNM

## 2018-06-22 ENCOUNTER — Ambulatory Visit (INDEPENDENT_AMBULATORY_CARE_PROVIDER_SITE_OTHER): Payer: Medicaid Other | Admitting: Advanced Practice Midwife

## 2018-06-22 VITALS — BP 116/81 | HR 90 | Wt 153.0 lb

## 2018-06-22 DIAGNOSIS — Z3400 Encounter for supervision of normal first pregnancy, unspecified trimester: Secondary | ICD-10-CM

## 2018-06-22 DIAGNOSIS — R252 Cramp and spasm: Secondary | ICD-10-CM

## 2018-06-22 DIAGNOSIS — O9989 Other specified diseases and conditions complicating pregnancy, childbirth and the puerperium: Secondary | ICD-10-CM

## 2018-06-22 NOTE — Patient Instructions (Addendum)
For Leg pain:  Try Magnesium [phone] mg every night or 500 mg twice per day.  Third Trimester of Pregnancy The third trimester is from week 28 through week 40 (months 7 through 9). The third trimester is a time when the unborn baby (fetus) is growing rapidly. At the end of the ninth month, the fetus is about 20 inches in length and weighs 6-10 pounds. Body changes during your third trimester Your body will continue to go through many changes during pregnancy. The changes vary from woman to woman. During the third trimester:  Your weight will continue to increase. You can expect to gain 25-35 pounds (11-16 kg) by the end of the pregnancy.  You may begin to get stretch marks on your hips, abdomen, and breasts.  You may urinate more often because the fetus is moving lower into your pelvis and pressing on your bladder.  You may develop or continue to have heartburn. This is caused by increased hormones that slow down muscles in the digestive tract.  You may develop or continue to have constipation because increased hormones slow digestion and cause the muscles that push waste through your intestines to relax.  You may develop hemorrhoids. These are swollen veins (varicose veins) in the rectum that can itch or be painful.  You may develop swollen, bulging veins (varicose veins) in your legs.  You may have increased body aches in the pelvis, back, or thighs. This is due to weight gain and increased hormones that are relaxing your joints.  You may have changes in your hair. These can include thickening of your hair, rapid growth, and changes in texture. Some women also have hair loss during or after pregnancy, or hair that feels dry or thin. Your hair will most likely return to normal after your baby is born.  Your breasts will continue to grow and they will continue to become tender. A yellow fluid (colostrum) may leak from your breasts. This is the first milk you are producing for your  baby.  Your belly button may stick out.  You may notice more swelling in your hands, face, or ankles.  You may have increased tingling or numbness in your hands, arms, and legs. The skin on your belly may also feel numb.  You may feel short of breath because of your expanding uterus.  You may have more problems sleeping. This can be caused by the size of your belly, increased need to urinate, and an increase in your body's metabolism.  You may notice the fetus "dropping," or moving lower in your abdomen (lightening).  You may have increased vaginal discharge.  You may notice your joints feel loose and you may have pain around your pelvic bone.  What to expect at prenatal visits You will have prenatal exams every 2 weeks until week 36. Then you will have weekly prenatal exams. During a routine prenatal visit:  You will be weighed to make sure you and the baby are growing normally.  Your blood pressure will be taken.  Your abdomen will be measured to track your baby's growth.  The fetal heartbeat will be listened to.  Any test results from the previous visit will be discussed.  You may have a cervical check near your due date to see if your cervix has softened or thinned (effaced).  You will be tested for Group B streptococcus. This happens between 35 and 37 weeks.  Your health care provider may ask you:  What your birth plan is.  How you are feeling.  If you are feeling the baby move.  If you have had any abnormal symptoms, such as leaking fluid, bleeding, severe headaches, or abdominal cramping.  If you are using any tobacco products, including cigarettes, chewing tobacco, and electronic cigarettes.  If you have any questions.  Other tests or screenings that may be performed during your third trimester include:  Blood tests that check for low iron levels (anemia).  Fetal testing to check the health, activity level, and growth of the fetus. Testing is done if you  have certain medical conditions or if there are problems during the pregnancy.  Nonstress test (NST). This test checks the health of your baby to make sure there are no signs of problems, such as the baby not getting enough oxygen. During this test, a belt is placed around your belly. The baby is made to move, and its heart rate is monitored during movement.  What is false labor? False labor is a condition in which you feel small, irregular tightenings of the muscles in the womb (contractions) that usually go away with rest, changing position, or drinking water. These are called Braxton Hicks contractions. Contractions may last for hours, days, or even weeks before true labor sets in. If contractions come at regular intervals, become more frequent, increase in intensity, or become painful, you should see your health care provider. What are the signs of labor?  Abdominal cramps.  Regular contractions that start at 10 minutes apart and become stronger and more frequent with time.  Contractions that start on the top of the uterus and spread down to the lower abdomen and back.  Increased pelvic pressure and dull back pain.  A watery or bloody mucus discharge that comes from the vagina.  Leaking of amniotic fluid. This is also known as your "water breaking." It could be a slow trickle or a gush. Let your health care provider know if it has a color or strange odor. If you have any of these signs, call your health care provider right away, even if it is before your due date. Follow these instructions at home: Medicines  Follow your health care provider's instructions regarding medicine use. Specific medicines may be either safe or unsafe to take during pregnancy.  Take a prenatal vitamin that contains at least 600 micrograms (mcg) of folic acid.  If you develop constipation, try taking a stool softener if your health care provider approves. Eating and drinking  Eat a balanced diet that  includes fresh fruits and vegetables, whole grains, good sources of protein such as meat, eggs, or tofu, and low-fat dairy. Your health care provider will help you determine the amount of weight gain that is right for you.  Avoid raw meat and uncooked cheese. These carry germs that can cause birth defects in the baby.  If you have low calcium intake from food, talk to your health care provider about whether you should take a daily calcium supplement.  Eat four or five small meals rather than three large meals a day.  Limit foods that are high in fat and processed sugars, such as fried and sweet foods.  To prevent constipation: ? Drink enough fluid to keep your urine clear or pale yellow. ? Eat foods that are high in fiber, such as fresh fruits and vegetables, whole grains, and beans. Activity  Exercise only as directed by your health care provider. Most women can continue their usual exercise routine during pregnancy. Try to exercise for 30  minutes at least 5 days a week. Stop exercising if you experience uterine contractions.  Avoid heavy lifting.  Do not exercise in extreme heat or humidity, or at high altitudes.  Wear low-heel, comfortable shoes.  Practice good posture.  You may continue to have sex unless your health care provider tells you otherwise. Relieving pain and discomfort  Take frequent breaks and rest with your legs elevated if you have leg cramps or low back pain.  Take warm sitz baths to soothe any pain or discomfort caused by hemorrhoids. Use hemorrhoid cream if your health care provider approves.  Wear a good support bra to prevent discomfort from breast tenderness.  If you develop varicose veins: ? Wear support pantyhose or compression stockings as told by your healthcare provider. ? Elevate your feet for 15 minutes, 3-4 times a day. Prenatal care  Write down your questions. Take them to your prenatal visits.  Keep all your prenatal visits as told by your  health care provider. This is important. Safety  Wear your seat belt at all times when driving.  Make a list of emergency phone numbers, including numbers for family, friends, the hospital, and police and fire departments. General instructions  Avoid cat litter boxes and soil used by cats. These carry germs that can cause birth defects in the baby. If you have a cat, ask someone to clean the litter box for you.  Do not travel far distances unless it is absolutely necessary and only with the approval of your health care provider.  Do not use hot tubs, steam rooms, or saunas.  Do not drink alcohol.  Do not use any products that contain nicotine or tobacco, such as cigarettes and e-cigarettes. If you need help quitting, ask your health care provider.  Do not use any medicinal herbs or unprescribed drugs. These chemicals affect the formation and growth of the baby.  Do not douche or use tampons or scented sanitary pads.  Do not cross your legs for long periods of time.  To prepare for the arrival of your baby: ? Take prenatal classes to understand, practice, and ask questions about labor and delivery. ? Make a trial run to the hospital. ? Visit the hospital and tour the maternity area. ? Arrange for maternity or paternity leave through employers. ? Arrange for family and friends to take care of pets while you are in the hospital. ? Purchase a rear-facing car seat and make sure you know how to install it in your car. ? Pack your hospital bag. ? Prepare the baby's nursery. Make sure to remove all pillows and stuffed animals from the baby's crib to prevent suffocation.  Visit your dentist if you have not gone during your pregnancy. Use a soft toothbrush to brush your teeth and be gentle when you floss. Contact a health care provider if:  You are unsure if you are in labor or if your water has broken.  You become dizzy.  You have mild pelvic cramps, pelvic pressure, or nagging pain  in your abdominal area.  You have lower back pain.  You have persistent nausea, vomiting, or diarrhea.  You have an unusual or bad smelling vaginal discharge.  You have pain when you urinate. Get help right away if:  Your water breaks before 37 weeks.  You have regular contractions less than 5 minutes apart before 37 weeks.  You have a fever.  You are leaking fluid from your vagina.  You have spotting or bleeding from your vagina.  You have severe abdominal pain or cramping.  You have rapid weight loss or weight gain.  You have shortness of breath with chest pain.  You notice sudden or extreme swelling of your face, hands, ankles, feet, or legs.  Your baby makes fewer than 10 movements in 2 hours.  You have severe headaches that do not go away when you take medicine.  You have vision changes. Summary  The third trimester is from week 28 through week 40, months 7 through 9. The third trimester is a time when the unborn baby (fetus) is growing rapidly.  During the third trimester, your discomfort may increase as you and your baby continue to gain weight. You may have abdominal, leg, and back pain, sleeping problems, and an increased need to urinate.  During the third trimester your breasts will keep growing and they will continue to become tender. A yellow fluid (colostrum) may leak from your breasts. This is the first milk you are producing for your baby.  False labor is a condition in which you feel small, irregular tightenings of the muscles in the womb (contractions) that eventually go away. These are called Braxton Hicks contractions. Contractions may last for hours, days, or even weeks before true labor sets in.  Signs of labor can include: abdominal cramps; regular contractions that start at 10 minutes apart and become stronger and more frequent with time; watery or bloody mucus discharge that comes from the vagina; increased pelvic pressure and dull back pain; and  leaking of amniotic fluid. This information is not intended to replace advice given to you by your health care provider. Make sure you discuss any questions you have with your health care provider. Document Released: 11/12/2001 Document Revised: 04/25/2016 Document Reviewed: 01/19/2013 Elsevier Interactive Patient Education  2017 [employer]

## 2018-06-22 NOTE — Progress Notes (Signed)
   PRENATAL VISIT NOTE  Subjective:  Ashley Robles is a 27 y.o. G1P0 at 7469w2d being seen today for ongoing prenatal care.  She is currently monitored for the following issues for this low-risk pregnancy and has Supervision of normal first pregnancy, antepartum and Nausea/vomiting in pregnancy on their problem list.  Patient reports leg pain bilaterally, aching at rest, pain bilaterally in knees.  Contractions: Not present. Vag. Bleeding: None.  Movement: Present. Denies leaking of fluid.   The following portions of the patient's history were reviewed and updated as appropriate: allergies, current medications, past family history, past medical history, past social history, past surgical history and problem list. Problem list updated.  Objective:   Vitals:   06/22/18 0937  BP: 116/81  Pulse: 90  Weight: 153 lb (69.4 kg)    Fetal Status: Fetal Heart Rate (bpm): 130   Movement: Present     General:  Alert, oriented and cooperative. Patient is in no acute distress.  Skin: Skin is warm and dry. No rash noted.   Cardiovascular: Normal heart rate noted  Respiratory: Normal respiratory effort, no problems with respiration noted  Abdomen: Soft, gravid, appropriate for gestational age.  Pain/Pressure: Present     Pelvic: Cervical exam deferred        Extremities: Normal range of motion.     Mental Status: Normal mood and affect. Normal behavior. Normal judgment and thought content.   Assessment and Plan:  Pregnancy: G1P0 at 6269w2d  1. Supervision of normal first pregnancy, antepartum --Anticipatory guidance about next visits/weeks of pregnancy given.  2. Leg cramps in pregnancy --Aching legs more than cramps but occurs at rest and at night most days.  --Reviewed a joint supplement pt asked about, ingredients likely safe but chromium, boron, and others are not well studied. --Discuss using magnesium supplement, increasing PO fluids, and heat PRN.   --Knee pain may be related to relaxin in  pregnancy.  Discussed good ergonomics to prevent injury.  Preterm labor symptoms and general obstetric precautions including but not limited to vaginal bleeding, contractions, leaking of fluid and fetal movement were reviewed in detail with the patient. Please refer to After Visit Summary for other counseling recommendations.  Return in about 2 weeks (around 07/06/2018).  Future Appointments  Date Time Provider Department Center  07/03/2018 10:00 AM Sharyon Cableogers, Veronica C, CNM [facility] None    Sharen CounterLisa Leftwich-Kirby, CNM

## 2018-07-03 ENCOUNTER — Encounter: Payer: Self-pay | Admitting: Certified Nurse Midwife

## 2018-07-03 ENCOUNTER — Encounter: Payer: Medicaid Other | Admitting: Certified Nurse Midwife

## 2018-07-03 ENCOUNTER — Other Ambulatory Visit: Payer: Self-pay

## 2018-07-03 ENCOUNTER — Ambulatory Visit (INDEPENDENT_AMBULATORY_CARE_PROVIDER_SITE_OTHER): Payer: Medicaid Other | Admitting: Certified Nurse Midwife

## 2018-07-03 DIAGNOSIS — Z3400 Encounter for supervision of normal first pregnancy, unspecified trimester: Secondary | ICD-10-CM

## 2018-07-03 DIAGNOSIS — Z23 Encounter for immunization: Secondary | ICD-10-CM | POA: Diagnosis not present

## 2018-07-03 DIAGNOSIS — Z3403 Encounter for supervision of normal first pregnancy, third trimester: Secondary | ICD-10-CM

## 2018-07-03 NOTE — Patient Instructions (Signed)

## 2018-07-03 NOTE — Progress Notes (Signed)
Patient wants to access the [employer] only. TDAP given in left deltoid, tolerated well.

## 2018-07-06 ENCOUNTER — Encounter: Payer: Medicaid Other | Admitting: Obstetrics and Gynecology

## 2018-07-06 NOTE — Progress Notes (Signed)
   PRENATAL VISIT NOTE  Subjective:  Ashley Robles is a 27 y.o. G1P0 at 389w2d being seen today for ongoing prenatal care.  She is currently monitored for the following issues for this low-risk pregnancy and has Supervision of normal first pregnancy, antepartum and Nausea/vomiting in pregnancy on their problem list.  Patient reports no complaints.  Contractions: Irritability. Vag. Bleeding: None.  Movement: Present. Denies leaking of fluid.   The following portions of the patient's history were reviewed and updated as appropriate: allergies, current medications, past family history, past medical history, past social history, past surgical history and problem list. Problem list updated.  Objective:   Vitals:   07/03/18 1032  BP: 94/63  Pulse: 84  Weight: 157 lb 11.2 oz (71.5 kg)    Fetal Status: Fetal Heart Rate (bpm): 141 Fundal Height: 31 cm Movement: Present     General:  Alert, oriented and cooperative. Patient is in no acute distress.  Skin: Skin is warm and dry. No rash noted.   Cardiovascular: Normal heart rate noted  Respiratory: Normal respiratory effort, no problems with respiration noted  Abdomen: Soft, gravid, appropriate for gestational age.  Pain/Pressure: Present     Pelvic: Cervical exam deferred        Extremities: Normal range of motion.  Edema: None  Mental Status: Normal mood and affect. Normal behavior. Normal judgment and thought content.   Assessment and Plan:  Pregnancy: G1P0 at 329w2d  1. Supervision of normal first pregnancy, antepartum -Patient doing well, no complaints  -Anticipatory guidance on upcoming visits  - Discussed patient's plan for labor, patient only wants her and Husband in room during labor and delivery  - Tdap vaccine greater than or equal to 7yo IM - Enroll Patient in [employer]cripts  Preterm labor symptoms and general obstetric precautions including but not limited to vaginal bleeding, contractions, leaking of fluid and fetal movement were  reviewed in detail with the patient. Please refer to After Visit Summary for other counseling recommendations.  Return in about 2 weeks (around 07/17/2018) for ROB.  Future Appointments  Date Time Provider Department Center  07/17/2018 11:10 AM Raelyn Moraawson, Rolitta, CNM [facility] None    Sharyon CableVeronica C Merranda Bolls, CNM

## 2018-07-06 NOTE — Progress Notes (Signed)
duplicate

## 2018-07-17 ENCOUNTER — Ambulatory Visit (INDEPENDENT_AMBULATORY_CARE_PROVIDER_SITE_OTHER): Payer: Medicaid Other | Admitting: Obstetrics and Gynecology

## 2018-07-17 VITALS — BP 106/70 | HR 98 | Wt 161.8 lb

## 2018-07-17 DIAGNOSIS — M25511 Pain in right shoulder: Secondary | ICD-10-CM

## 2018-07-17 DIAGNOSIS — Z3400 Encounter for supervision of normal first pregnancy, unspecified trimester: Secondary | ICD-10-CM

## 2018-07-17 DIAGNOSIS — R51 Headache: Secondary | ICD-10-CM

## 2018-07-17 DIAGNOSIS — O26893 Other specified pregnancy related conditions, third trimester: Secondary | ICD-10-CM

## 2018-07-17 NOTE — Progress Notes (Signed)
   PRENATAL VISIT NOTE  Subjective:  Ashley Robles is a 27 y.o. G1P0 at 7957w6d being seen today for ongoing prenatal care.  She is currently monitored for the following issues for this low-risk pregnancy and has Supervision of normal first pregnancy, antepartum and Nausea/vomiting in pregnancy on their problem list.  Patient reports headache and RT shoulder pain x 2 wks. She states that she has only taken 1-2 Tylenol over the 2 wk timeframe. She states that when she took the Tylenol, it helped her H/A.  Contractions: Irregular. Vag. Bleeding: None.  Movement: Present. Denies leaking of fluid.   The following portions of the patient's history were reviewed and updated as appropriate: allergies, current medications, past family history, past medical history, past social history, past surgical history and problem list. Problem list updated.  Objective:   Vitals:   07/17/18 1118  BP: 106/70  Pulse: 98  Weight: 161 lb 12.8 oz (73.4 kg)    Fetal Status: Fetal Heart Rate (bpm): 145 Fundal Height: 34 cm Movement: Present     General:  Alert, oriented and cooperative. Patient is in no acute distress.  Skin: Skin is warm and dry. No rash noted.   Cardiovascular: Normal heart rate noted  Respiratory: Normal respiratory effort, no problems with respiration noted  Abdomen: Soft, gravid, appropriate for gestational age.  Pain/Pressure: Present     Pelvic: Cervical exam deferred        Extremities: Normal range of motion.  Edema: None  Mental Status: Normal mood and affect. Normal behavior. Normal judgment and thought content.   Assessment and Plan:  Pregnancy: G1P0 at 6157w6d  Supervision of normal first pregnancy, antepartum - Anticipatory guidance for GBS and VE at nv given  Acute pain of right shoulder - Advised to buy OTC muscle balm like Tiger Balm, Bengay or Biofreeze and use a heating pad on her shoulder - If no relief from suggested comfort measures, a referral can be made  Pregnancy  headache in third trimester - Advised to that taking Tylenol 1000 mg every 6 hrs prn pain is safe and recommended in pregnancy - With the absence of elevated BPs or PEC sx's no labs or further screening is indicated at this time -- reassurance given  Preterm labor symptoms and general obstetric precautions including but not limited to vaginal bleeding, contractions, leaking of fluid and fetal movement were reviewed in detail with the patient. Please refer to After Visit Summary for other counseling recommendations.  Return in about 2 weeks (around 07/31/2018) for Return OB visit - GBS and cervical check.  Future Appointments  Date Time Provider Department Center  07/29/2018  1:15 PM Reva BoresPratt, Tanya S, MD [facility] None    Ashley Robles, CNM

## 2018-07-17 NOTE — Progress Notes (Signed)
Pt complains of having headaches for the last 2 weeks, and having right shoulder pain.

## 2018-07-17 NOTE — Patient Instructions (Signed)
Be prepared for GBS (group Beta Strep) testing and cervical check at next visit

## 2018-07-26 ENCOUNTER — Encounter (HOSPITAL_COMMUNITY): Payer: Self-pay | Admitting: *Deleted

## 2018-07-26 ENCOUNTER — Inpatient Hospital Stay (HOSPITAL_COMMUNITY)
Admission: AD | Admit: 2018-07-26 | Discharge: 2018-07-26 | Disposition: A | Discharge status: Home / Self Care | Payer: Medicaid Other | Source: Ambulatory Visit | Attending: Obstetrics & Gynecology | Admitting: Obstetrics & Gynecology

## 2018-07-26 DIAGNOSIS — O26893 Other specified pregnancy related conditions, third trimester: Secondary | ICD-10-CM | POA: Insufficient documentation

## 2018-07-26 DIAGNOSIS — Z3A36 36 weeks gestation of pregnancy: Secondary | ICD-10-CM

## 2018-07-26 DIAGNOSIS — R197 Diarrhea, unspecified: Secondary | ICD-10-CM | POA: Insufficient documentation

## 2018-07-26 HISTORY — DX: Anemia, unspecified: D64.9

## 2018-07-26 HISTORY — DX: Unspecified osteoarthritis, unspecified site: M19.90

## 2018-07-26 LAB — URINALYSIS, ROUTINE W REFLEX MICROSCOPIC
Bilirubin Urine: NEGATIVE
Glucose, UA: NEGATIVE mg/dL
Hgb urine dipstick: NEGATIVE
Ketones, ur: NEGATIVE mg/dL
LEUKOCYTES UA: NEGATIVE
NITRITE: NEGATIVE
PH: 6 (ref 5.0–8.0)
Protein, ur: NEGATIVE mg/dL
Specific Gravity, Urine: 1.006 (ref 1.005–1.030)

## 2018-07-26 NOTE — MAU Note (Signed)
Pt states she woke up this morning with mid abdominal pain and gas. States soon after she started having loose stools. States she has had 2 episodes of loose stools. States she has been able to eat and drink normal. States the pain has subsided some but she also has a HA. Pt denies contractions, LOF, or vaginal bleeding. Pt reports has felt fetal movement although it is less than normal

## 2018-07-26 NOTE — MAU Provider Note (Signed)
Chief Complaint:  Diarrhea   First Provider Initiated Contact with Patient 07/26/18 2230     HPI: Ashley Robles is a 27 y.o. G1P0 at 7051w1d who presents to maternity admissions reporting diarrhea. Symptoms began this afternoon after eating chicken soup. Immediately had 2 episodes of loose/watery stools. Ate biscuits & buttermilk tonight without diarrhea. Denies n/v, sick contacts, fever/chills, or abdominal pain. Noticed some decreased movement this evening. No vaginal bleeding or LOF.    Past Medical History:  Diagnosis Date  . Anemia   . Arthritis   . Vitamin D deficiency    OB History  Gravida Para Term Preterm AB Living  1            SAB TAB Ectopic Multiple Live Births               # Outcome Date GA Lbr Len/2nd Weight Sex Delivery Anes PTL Lv  1 Current            Past Surgical History:  Procedure Laterality Date  . NO PAST SURGERIES     Family History  Problem Relation Age of Onset  . Diabetes Mother   . Thyroid disease Mother   . Diabetes Father   . Alzheimer's disease Maternal Grandfather    Social History   Tobacco Use  . Smoking status: Never Smoker  . Smokeless tobacco: Never Used  Substance Use Topics  . Alcohol use: No    Frequency: Never  . Drug use: No   Allergies  Allergen Reactions  . Sesame Seed Extract Allergy Skin Test Itching   Medications Prior to Admission  Medication Sig Dispense Refill Last Dose  . Prenatal-Fe Fum-Methf-FA w/o A (VITAFOL-NANO) 18-0.6-0.4 MG TABS Take 1 tablet by mouth daily. 30 tablet 12 07/25/2018 at Unknown time  . Doxylamine-Pyridoxine (DICLEGIS) 10-10 MG TBEC Take 1 tablet with breakfast and lunch.  Take 2 tablets at bedtime. 100 tablet 4 Unknown at Unknown time  . omeprazole (PRILOSEC) 20 MG capsule Take 1 capsule (20 mg total) by mouth 2 (two) times daily before a meal. 60 capsule 5 Unknown at Unknown time  . ondansetron (ZOFRAN) 4 MG tablet Take 4 mg by mouth every 8 (eight) hours as needed for nausea or vomiting.    Unknown at Unknown time  . prenatal vitamin w/FE, FA (PRENATAL 1 + 1) 27-1 MG TABS tablet Take 1 tablet by mouth daily at 12 noon.   Taking    I have reviewed patient's Past Medical Hx, Surgical Hx, Family Hx, Social Hx, medications and allergies.   ROS:  Review of Systems  Constitutional: Negative.   Gastrointestinal: Positive for diarrhea. Negative for abdominal pain, anal bleeding, blood in stool, nausea, rectal pain and vomiting.  Genitourinary: Negative.     Physical Exam   Patient Vitals for the past 24 hrs:  BP Temp Temp src Pulse Resp SpO2 Height Weight  07/26/18 2201 110/65 98.1 F (36.7 C) Oral 88 16 98 % 5\' 3"  (1.6 m) 76.2 kg    Constitutional: Well-developed, well-nourished female in no acute distress.  Cardiovascular: normal rate & rhythm, no murmur Respiratory: normal effort, lung sounds clear throughout GI: Abd soft, non-tender, gravid appropriate for gestational age. Pos BS x 4 MS: Extremities nontender, no edema, normal ROM Neurologic: Alert and oriented x 4.   NST:  Baseline: 140 bpm, Variability: Good {> 6 bpm), Accelerations: Reactive and Decelerations: Absent   Labs: Results for orders placed or performed during the hospital encounter of 07/26/18 (from the past  24 hour(s))  Urinalysis, Routine w reflex microscopic     Status: Abnormal   Collection Time: 07/26/18 10:14 PM  Result Value Ref Range   Color, Urine STRAW (A) YELLOW   APPearance CLEAR CLEAR   Specific Gravity, Urine 1.006 1.005 - 1.030   pH 6.0 5.0 - 8.0   Glucose, UA NEGATIVE NEGATIVE mg/dL   Hgb urine dipstick NEGATIVE NEGATIVE   Bilirubin Urine NEGATIVE NEGATIVE   Ketones, ur NEGATIVE NEGATIVE mg/dL   Protein, ur NEGATIVE NEGATIVE mg/dL   Nitrite NEGATIVE NEGATIVE   Leukocytes, UA NEGATIVE NEGATIVE    Imaging:  No results found.  MAU Course: Orders Placed This Encounter  Procedures  . Urinalysis, Routine w reflex microscopic  . Discharge patient   No orders of the defined  types were placed in this encounter.   MDM: Reactive NST Pt asymptomatic in MAU. U/a & VS negative for signs of dehydration. Pt states she just wanted to make sure everything was ok. Reassured by u/a and tracing. Discussed Bland diet, good hand hygiene, & increased fluid intake.   Assessment: 1. Diarrhea, unspecified type   2. [redacted] weeks gestation of pregnancy     Plan: Discharge home in stable condition.  Labor precautions and fetal kick counts Bland diet for diarrhea   Allergies as of 07/26/2018      Reactions   Sesame Seed Extract Allergy Skin Test Itching      Medication List    STOP taking these medications   prenatal vitamin w/FE, FA 27-1 MG Tabs tablet     TAKE these medications   Doxylamine-Pyridoxine 10-10 MG Tbec Take 1 tablet with breakfast and lunch.  Take 2 tablets at bedtime.   omeprazole 20 MG capsule Commonly known as:  PRILOSEC Take 1 capsule (20 mg total) by mouth 2 (two) times daily before a meal.   ondansetron 4 MG tablet Commonly known as:  ZOFRAN Take 4 mg by mouth every 8 (eight) hours as needed for nausea or vomiting.   VITAFOL-NANO 18-0.6-0.4 MG Tabs Take 1 tablet by mouth daily.       Ashley Horn, NP 07/26/2018 10:44 PM

## 2018-07-26 NOTE — Discharge Instructions (Signed)
Diarrhea, Adult Diarrhea is frequent loose and watery bowel movements. Diarrhea can make you feel weak and cause you to become dehydrated. Dehydration can make you tired and thirsty, cause you to have a dry mouth, and decrease how often you urinate. Diarrhea typically lasts 2-3 days. However, it can last longer if it is a sign of something more serious. It is important to treat your diarrhea as told by your health care provider. Follow these instructions at home: Eating and drinking  Follow these recommendations as told by your health care provider:  Take an oral rehydration solution (ORS). This is a drink that is sold at pharmacies and retail stores.  Drink clear fluids, such as water, ice chips, diluted fruit juice, and low-calorie sports drinks.  Eat bland, easy-to-digest foods in small amounts as you are able. These foods include bananas, applesauce, rice, lean meats, toast, and crackers.  Avoid drinking fluids that contain a lot of sugar or caffeine, such as energy drinks, sports drinks, and soda.  Avoid alcohol.  Avoid spicy or fatty foods.  General instructions  Drink enough fluid to keep your urine clear or pale yellow.  Wash your hands often. If soap and water are not available, use hand sanitizer.  Make sure that all people in your household wash their hands well and often.  Take over-the-counter and prescription medicines only as told by your health care provider.  Rest at home while you recover.  Watch your condition for any changes.  Take a warm bath to relieve any burning or pain from frequent diarrhea episodes.  Keep all follow-up visits as told by your health care provider. This is important. Contact a health care provider if:  You have a fever.  Your diarrhea gets worse.  You have new symptoms.  You cannot keep fluids down.  You feel light-headed or dizzy.  You have a headache  You have muscle cramps. Get help right away if:  You have chest  pain.  You feel extremely weak or you faint.  You have bloody or black stools or stools that look like tar.  You have severe pain, cramping, or bloating in your abdomen.  You have trouble breathing or you are breathing very quickly.  Your heart is beating very quickly.  Your skin feels cold and clammy.  You feel confused.  You have signs of dehydration, such as: ? Dark urine, very little urine, or no urine. ? Cracked lips. ? Dry mouth. ? Sunken eyes. ? Sleepiness. ? Weakness. This information is not intended to replace advice given to you by your health care provider. Make sure you discuss any questions you have with your health care provider. Document Released: 11/08/2002 Document Revised: 03/28/2016 Document Reviewed: 07/25/2015 Elsevier Interactive Patient Education  2018 Elsevier Inc[employer]     Fetal Movement Counts Patient Name: ________________________________________________ Patient Due Date: ____________________ What is a fetal movement count? A fetal movement count is the number of times that you feel your baby move during a certain amount of time. This may also be called a fetal kick count. A fetal movement count is recommended for every pregnant woman. You may be asked to start counting fetal movements as early as week 28 of your pregnancy. Pay attention to when your baby is most active. You may notice your baby's sleep and wake cycles. You may also notice things that make your baby move more. You should do a fetal movement count:  When your baby is normally most active.  At the same  time each day.  A good time to count movements is while you are resting, after having something to eat and drink. How do I count fetal movements? 1. Find a quiet, comfortable area. Sit, or lie down on your side. 2. Write down the date, the start time and stop time, and the number of movements that you felt between those two times. Take this information with you to your health care  visits. 3. For 2 hours, count kicks, flutters, swishes, rolls, and jabs. You should feel at least 10 movements during 2 hours. 4. You may stop counting after you have felt 10 movements. 5. If you do not feel 10 movements in 2 hours, have something to eat and drink. Then, keep resting and counting for 1 hour. If you feel at least 4 movements during that hour, you may stop counting. Contact a health care provider if:  You feel fewer than 4 movements in 2 hours.  Your baby is not moving like he or she usually does. Date: ____________ Start time: ____________ Stop time: ____________ Movements: ____________ Date: ____________ Start time: ____________ Stop time: ____________ Movements: ____________ Date: ____________ Start time: ____________ Stop time: ____________ Movements: ____________ Date: ____________ Start time: ____________ Stop time: ____________ Movements: ____________ Date: ____________ Start time: ____________ Stop time: ____________ Movements: ____________ Date: ____________ Start time: ____________ Stop time: ____________ Movements: ____________ Date: ____________ Start time: ____________ Stop time: ____________ Movements: ____________ Date: ____________ Start time: ____________ Stop time: ____________ Movements: ____________ Date: ____________ Start time: ____________ Stop time: ____________ Movements: ____________ This information is not intended to replace advice given to you by your health care provider. Make sure you discuss any questions you have with your health care provider. Document Released: 12/18/2006 Document Revised: 07/17/2016 Document Reviewed: 12/28/2015 Elsevier Interactive Patient Education  [employer]2018 Else[employer] Inc.

## 2018-07-29 ENCOUNTER — Other Ambulatory Visit (HOSPITAL_COMMUNITY)
Admission: RE | Admit: 2018-07-29 | Discharge: 2018-07-29 | Disposition: A | Discharge status: Home / Self Care | Payer: Medicaid Other | Source: Ambulatory Visit | Attending: Family Medicine | Admitting: Family Medicine

## 2018-07-29 ENCOUNTER — Ambulatory Visit (INDEPENDENT_AMBULATORY_CARE_PROVIDER_SITE_OTHER): Payer: Medicaid Other | Admitting: Family Medicine

## 2018-07-29 DIAGNOSIS — Z3A36 36 weeks gestation of pregnancy: Secondary | ICD-10-CM | POA: Diagnosis not present

## 2018-07-29 DIAGNOSIS — Z3400 Encounter for supervision of normal first pregnancy, unspecified trimester: Secondary | ICD-10-CM

## 2018-07-29 DIAGNOSIS — Z3403 Encounter for supervision of normal first pregnancy, third trimester: Secondary | ICD-10-CM | POA: Insufficient documentation

## 2018-07-29 NOTE — Patient Instructions (Signed)

## 2018-07-29 NOTE — Progress Notes (Signed)
   PRENATAL VISIT NOTE  Subjective:  Ashley Robles is a 27 y.o. G1P0 at 4883w4d being seen today for ongoing prenatal care.  She is currently monitored for the following issues for this low-risk pregnancy and has Supervision of normal first pregnancy, antepartum and Nausea/vomiting in pregnancy on their problem list.  Patient reports no complaints.  Contractions: Irregular. Vag. Bleeding: None.  Movement: Present. Denies leaking of fluid.   The following portions of the patient's history were reviewed and updated as appropriate: allergies, current medications, past family history, past medical history, past social history, past surgical history and problem list. Problem list updated.  Objective:   Vitals:   07/29/18 1323  BP: 112/76  Pulse: 83  Weight: 165 lb (74.8 kg)    Fetal Status: Fetal Heart Rate (bpm): 150 Fundal Height: 35 cm Movement: Present  Presentation: Vertex  General:  Alert, oriented and cooperative. Patient is in no acute distress.  Skin: Skin is warm and dry. No rash noted.   Cardiovascular: Normal heart rate noted  Respiratory: Normal respiratory effort, no problems with respiration noted  Abdomen: Soft, gravid, appropriate for gestational age.  Pain/Pressure: Present     Pelvic: Cervical exam performed Dilation: Closed Effacement (%): 30 Station: Ballotable  Extremities: Normal range of motion.  Edema: None  Mental Status: Normal mood and affect. Normal behavior. Normal judgment and thought content.   Assessment and Plan:  Pregnancy: G1P0 at 883w4d  1. Supervision of normal first pregnancy, antepartum Cultures today - Strep Gp B NAA - Cervicovaginal ancillary only  Preterm labor symptoms and general obstetric precautions including but not limited to vaginal bleeding, contractions, leaking of fluid and fetal movement were reviewed in detail with the patient. Please refer to After Visit Summary for other counseling recommendations.  Return in 1 week (on  08/05/2018).  Future Appointments  Date Time Provider Department Center  08/07/2018 10:45 AM Calvert CantorWeinhold, Samantha C, CNM [facility] None    Reva Boresanya S Douglass Dunshee, MD

## 2018-07-31 LAB — CERVICOVAGINAL ANCILLARY ONLY
CHLAMYDIA, DNA PROBE: NEGATIVE
Neisseria Gonorrhea: NEGATIVE

## 2018-07-31 LAB — STREP GP B NAA: STREP GROUP B AG: NEGATIVE

## 2018-08-07 ENCOUNTER — Encounter: Payer: Medicaid Other | Admitting: Advanced Practice Midwife

## 2018-08-08 ENCOUNTER — Inpatient Hospital Stay (HOSPITAL_COMMUNITY): Payer: Medicaid Other | Admitting: Anesthesiology

## 2018-08-08 ENCOUNTER — Encounter (HOSPITAL_COMMUNITY): Payer: Self-pay | Admitting: *Deleted

## 2018-08-08 ENCOUNTER — Inpatient Hospital Stay (HOSPITAL_BASED_OUTPATIENT_CLINIC_OR_DEPARTMENT_OTHER): Payer: Medicaid Other

## 2018-08-08 ENCOUNTER — Inpatient Hospital Stay (HOSPITAL_COMMUNITY)
Admission: AD | Admit: 2018-08-08 | Discharge: 2018-08-10 | DRG: 807 | Disposition: A | Discharge status: Home / Self Care | Payer: Medicaid Other | Attending: Obstetrics and Gynecology | Admitting: Obstetrics and Gynecology

## 2018-08-08 DIAGNOSIS — Z3483 Encounter for supervision of other normal pregnancy, third trimester: Secondary | ICD-10-CM | POA: Diagnosis present

## 2018-08-08 DIAGNOSIS — O4693 Antepartum hemorrhage, unspecified, third trimester: Secondary | ICD-10-CM

## 2018-08-08 DIAGNOSIS — Z3A38 38 weeks gestation of pregnancy: Secondary | ICD-10-CM | POA: Diagnosis not present

## 2018-08-08 LAB — CBC
HCT: 40.1 % (ref 36.0–46.0)
Hemoglobin: 13.6 g/dL (ref 12.0–15.0)
MCH: 30.6 pg (ref 26.0–34.0)
MCHC: 33.9 g/dL (ref 30.0–36.0)
MCV: 90.1 fL (ref 78.0–100.0)
PLATELETS: 255 10*3/uL (ref 150–400)
RBC: 4.45 MIL/uL (ref 3.87–5.11)
RDW: 13.4 % (ref 11.5–15.5)
WBC: 11.9 10*3/uL — AB (ref 4.0–10.5)

## 2018-08-08 LAB — TYPE AND SCREEN
ABO/RH(D): O POS
ANTIBODY SCREEN: NEGATIVE

## 2018-08-08 LAB — POCT FERN TEST: POCT Fern Test: POSITIVE

## 2018-08-08 LAB — ABO/RH: ABO/RH(D): O POS

## 2018-08-08 MED ORDER — LACTATED RINGERS IV SOLN: 500.0000 mL | Freq: Once | INTRAVENOUS | Status: DC

## 2018-08-08 MED ORDER — FENTANYL 2.5 MCG/ML BUPIVACAINE 1/10 % EPIDURAL INFUSION (WH - ANES)
14.0000 mL/h | INTRAMUSCULAR | Status: DC | PRN
  Administered 2018-08-08 (×2): 14 mL/h via EPIDURAL
  Filled 2018-08-08 (×2): qty 100

## 2018-08-08 MED ORDER — PHENYLEPHRINE 40 MCG/ML (10ML) SYRINGE FOR IV PUSH (FOR BLOOD PRESSURE SUPPORT)
80.0000 ug | PREFILLED_SYRINGE | INTRAVENOUS | Status: DC | PRN
  Filled 2018-08-08: qty 5

## 2018-08-08 MED ORDER — TERBUTALINE SULFATE 1 MG/ML IJ SOLN
INTRAMUSCULAR | Status: AC
  Filled 2018-08-08: qty 1

## 2018-08-08 MED ORDER — TERBUTALINE SULFATE 1 MG/ML IJ SOLN
INTRAMUSCULAR | Status: AC
  Administered 2018-08-08: 0.25 mg via SUBCUTANEOUS
  Filled 2018-08-08: qty 1

## 2018-08-08 MED ORDER — OXYTOCIN 40 UNITS IN LACTATED RINGERS INFUSION - SIMPLE MED: 1.0000 m[IU]/min | INTRAVENOUS | Status: DC

## 2018-08-08 MED ORDER — LIDOCAINE HCL (PF) 1 % IJ SOLN
INTRAMUSCULAR | Status: DC | PRN
  Administered 2018-08-08: 13 mL via EPIDURAL

## 2018-08-08 MED ORDER — OXYCODONE-ACETAMINOPHEN 5-325 MG PO TABS: 2.0000 | ORAL_TABLET | ORAL | Status: DC | PRN

## 2018-08-08 MED ORDER — ONDANSETRON HCL 4 MG/2ML IJ SOLN: 4.0000 mg | Freq: Four times a day (QID) | INTRAMUSCULAR | Status: DC | PRN

## 2018-08-08 MED ORDER — TERBUTALINE SULFATE 1 MG/ML IJ SOLN
0.2500 mg | Freq: Once | INTRAMUSCULAR | Status: AC
  Administered 2018-08-08: 0.25 mg via SUBCUTANEOUS

## 2018-08-08 MED ORDER — EPHEDRINE 5 MG/ML INJ
10.0000 mg | INTRAVENOUS | Status: DC | PRN
  Filled 2018-08-08: qty 2

## 2018-08-08 MED ORDER — FENTANYL CITRATE (PF) 100 MCG/2ML IJ SOLN
INTRAMUSCULAR | Status: AC
  Administered 2018-08-08: 100 ug
  Filled 2018-08-08: qty 2

## 2018-08-08 MED ORDER — SOD CITRATE-CITRIC ACID 500-334 MG/5ML PO SOLN: 30.0000 mL | ORAL | Status: DC | PRN

## 2018-08-08 MED ORDER — OXYTOCIN 40 UNITS IN LACTATED RINGERS INFUSION - SIMPLE MED
2.5000 [IU]/h | INTRAVENOUS | Status: DC
  Administered 2018-08-09: 2.5 [IU]/h via INTRAVENOUS
  Filled 2018-08-08 (×2): qty 1000

## 2018-08-08 MED ORDER — PHENYLEPHRINE 40 MCG/ML (10ML) SYRINGE FOR IV PUSH (FOR BLOOD PRESSURE SUPPORT)
80.0000 ug | PREFILLED_SYRINGE | INTRAVENOUS | Status: DC | PRN
  Administered 2018-08-08: 80 ug via INTRAVENOUS
  Filled 2018-08-08: qty 5
  Filled 2018-08-08 (×2): qty 10

## 2018-08-08 MED ORDER — TERBUTALINE SULFATE 1 MG/ML IJ SOLN
0.2500 mg | Freq: Once | INTRAMUSCULAR | Status: DC | PRN
  Administered 2018-08-08: 0.25 mg via SUBCUTANEOUS

## 2018-08-08 MED ORDER — LIDOCAINE-EPINEPHRINE (PF) 2 %-1:200000 IJ SOLN
INTRAMUSCULAR | Status: DC | PRN
  Administered 2018-08-08 (×2): 5 mL via INTRADERMAL

## 2018-08-08 MED ORDER — DIPHENHYDRAMINE HCL 50 MG/ML IJ SOLN: 12.5000 mg | INTRAMUSCULAR | Status: DC | PRN

## 2018-08-08 MED ORDER — MISOPROSTOL 50MCG HALF TABLET: 50.0000 ug | ORAL_TABLET | Freq: Once | ORAL | Status: DC

## 2018-08-08 MED ORDER — OXYCODONE-ACETAMINOPHEN 5-325 MG PO TABS: 1.0000 | ORAL_TABLET | ORAL | Status: DC | PRN

## 2018-08-08 MED ORDER — LACTATED RINGERS IV SOLN
INTRAVENOUS | Status: DC
  Administered 2018-08-08: 125 mL/h via INTRAVENOUS

## 2018-08-08 MED ORDER — EPHEDRINE 5 MG/ML INJ
10.0000 mg | INTRAVENOUS | Status: DC | PRN
  Administered 2018-08-08: 10 mg via INTRAVENOUS
  Filled 2018-08-08: qty 4
  Filled 2018-08-08: qty 2

## 2018-08-08 MED ORDER — LIDOCAINE HCL (PF) 1 % IJ SOLN
30.0000 mL | INTRAMUSCULAR | Status: DC | PRN
  Administered 2018-08-09: 30 mL via SUBCUTANEOUS
  Filled 2018-08-08: qty 30

## 2018-08-08 MED ORDER — LACTATED RINGERS IV SOLN
500.0000 mL | INTRAVENOUS | Status: DC | PRN
  Administered 2018-08-08: 500 mL via INTRAVENOUS

## 2018-08-08 MED ORDER — OXYTOCIN BOLUS FROM INFUSION
500.0000 mL | Freq: Once | INTRAVENOUS | Status: AC
  Administered 2018-08-09: 500 mL via INTRAVENOUS

## 2018-08-08 MED ORDER — FENTANYL CITRATE (PF) 100 MCG/2ML IJ SOLN
100.0000 ug | INTRAMUSCULAR | Status: DC | PRN
  Administered 2018-08-08: 100 ug via INTRAVENOUS
  Filled 2018-08-08: qty 2

## 2018-08-08 MED ORDER — MISOPROSTOL 50MCG HALF TABLET
50.0000 ug | ORAL_TABLET | Freq: Once | ORAL | Status: AC
  Administered 2018-08-08: 50 ug via ORAL
  Filled 2018-08-08: qty 1

## 2018-08-08 MED ORDER — ACETAMINOPHEN 325 MG PO TABS: 650.0000 mg | ORAL_TABLET | ORAL | Status: DC | PRN

## 2018-08-08 NOTE — Anesthesia Procedure Notes (Signed)
Epidural Patient location during procedure: OB Start time: 08/08/2018 5:24 PM End time: 08/08/2018 5:42 PM  Staffing Anesthesiologist: Lowella Curb, MD Performed: anesthesiologist   Preanesthetic Checklist Completed: patient identified, site marked, surgical consent, pre-op evaluation, timeout performed, IV checked, risks and benefits discussed and monitors and equipment checked  Epidural Patient position: sitting Prep: ChloraPrep Patient monitoring: heart rate, cardiac monitor, continuous pulse ox and blood pressure Approach: midline Location: L2-L3 Injection technique: LOR saline  Needle:  Needle type: Tuohy  Needle gauge: 17 G Needle length: 9 cm Needle insertion depth: 6 cm Catheter type: closed end flexible Catheter size: 20 Guage Catheter at skin depth: 10 cm Test dose: negative  Assessment Events: blood not aspirated, injection not painful, no injection resistance, negative IV test and no paresthesia  Additional Notes Reason for block:procedure for pain

## 2018-08-08 NOTE — Progress Notes (Signed)
Ashley Robles is a 27 y.o. G1P0 at [redacted]w[redacted]d by LMP admitted for rupture of membranes  Subjective: Pt uncomfortable, breathing and crying with contractions.  Family member in room for support.  Objective: BP 114/72   Pulse 75   Temp 98 F (36.7 C) (Oral)   Resp 16   Ht 5\' 3"  (1.6 m)   Wt 79 kg   LMP 11/15/2017   SpO2 99% Comment: ra  BMI 30.85 kg/m  No intake/output data recorded. No intake/output data recorded.  FHT:  FHR: 150 bpm, variability: moderate,  accelerations:  Present,  decelerations:  Absent UC:   regular, every 1-2 minutes SVE:   Dilation: 2 Effacement (%): 70 Station: -2 Exam by:: [employer]  Labs: Lab Results  Component Value Date   WBC 11.9 (H) 08/08/2018   HGB 13.6 08/08/2018   HCT 40.1 08/08/2018   MCV 90.1 08/08/2018   PLT 255 08/08/2018    Assessment / Plan: Augmentation of labor, progressing well S/P SROM and Cytotec x 1 oral dose  Labor: Progressing normally. Pt may have epidural. Desires IV pain medications first so Fentanyl 100 mcg Q hour ordered. Preeclampsia:  n/a Fetal Wellbeing:  Category I Pain Control:  Labor support without medications I/D:  GBS neg Anticipated MOD:  NSVD  Sharen Counter 08/08/2018, 3:57 PM

## 2018-08-08 NOTE — MAU Note (Addendum)
LOF clear with blood Fluid and blood noted to have gone through patients sheet, ems sheet, and pooling on ems stretcher. CNM in dept and made aware of concern of bleeding. arrival from EMS @[id number]  Contractions around 6am for about 15 minutes Slight lower abdominal cramping; intermittent; rating pain 4/10 Reports being closed last office visit; missed most recent appt due to oversleepin Denies any problems with this pregnancy

## 2018-08-08 NOTE — H&P (Signed)
Ashley Robles is a 27 y.o. female G1P0 with IUP at [redacted]w[redacted]d presenting for contractions. Pt states she started having a painful contractions for 15 minutes this morning at 6 am and then it stopped. Her water broke 40 minutes later.  She then had a big gush of fluid and blood at home. She then called 911.  She started bleeding again on EMS (see RN note). .  Membranes are intact, ruptured, with active fetal movement.   [facility] at [facility] since 1st trimester.   Prenatal History/Complications:  Past Medical History: Past Medical History:  Diagnosis Date  . Anemia   . Arthritis   . Vitamin D deficiency     Past Surgical History: Past Surgical History:  Procedure Laterality Date  . NO PAST SURGERIES      Obstetrical History: OB History    Gravida  1   Para      Term      Preterm      AB      Living        SAB      TAB      Ectopic      Multiple      Live Births               Social History: Social History   Socioeconomic History  . Marital status: Married    Spouse name: Not on file  . Number of children: Not on file  . Years of education: Not on file  . Highest education level: Not on file  Occupational History  . Not on file  Social Needs  . Financial resource strain: Not on file  . Food insecurity:    Worry: Not on file    Inability: Not on file  . Transportation needs:    Medical: Not on file    Non-medical: Not on file  Tobacco Use  . Smoking status: Never Smoker  . Smokeless tobacco: Never Used  Substance and Sexual Activity  . Alcohol use: No    Frequency: Never  . Drug use: No  . Sexual activity: Not Currently  Lifestyle  . Physical activity:    Days per week: Not on file    Minutes per session: Not on file  . Stress: Not on file  Relationships  . Social connections:    Talks on phone: Not on file    Gets together: Not on file    Attends religious service: Not on file    Active member of club or organization: Not on file    Attends  meetings of clubs or organizations: Not on file    Relationship status: Not on file  Other Topics Concern  . Not on file  Social History Narrative  . Not on file    Family History: Family History  Problem Relation Age of Onset  . Diabetes Mother   . Thyroid disease Mother   . Diabetes Father   . Alzheimer's disease Maternal Grandfather     Allergies: Allergies  Allergen Reactions  . Sesame Seed Extract Allergy Skin Test Itching    Medications Prior to Admission  Medication Sig Dispense Refill Last Dose  . Doxylamine-Pyridoxine (DICLEGIS) 10-10 MG TBEC Take 1 tablet with breakfast and lunch.  Take 2 tablets at bedtime. 100 tablet 4 Unknown at Unknown time  . omeprazole (PRILOSEC) 20 MG capsule Take 1 capsule (20 mg total) by mouth 2 (two) times daily before a meal. 60 capsule 5 Unknown at Unknown time  .  ondansetron (ZOFRAN) 4 MG tablet Take 4 mg by mouth every 8 (eight) hours as needed for nausea or vomiting.   Unknown at Unknown time  . Prenatal-Fe Fum-Methf-FA w/o A (VITAFOL-NANO) 18-0.6-0.4 MG TABS Take 1 tablet by mouth daily. 30 tablet 12 Taking        Review of Systems   Constitutional: Negative for fever and chills Eyes: Negative for visual disturbances Respiratory: Negative for shortness of breath, dyspnea Cardiovascular: Negative for chest pain or palpitations  Gastrointestinal: Negative for vomiting, diarrhea and constipation.  POSITIVE for abdominal pain (contractions) Genitourinary: Negative for dysuria and urgency Musculoskeletal: Negative for back pain, joint pain, myalgias  Neurological: Negative for dizziness and headaches      Blood pressure 120/84, pulse 75, temperature 98 F (36.7 C), temperature source Oral, resp. rate 16, last menstrual period 11/15/2017, SpO2 99 %. General appearance: alert, cooperative and mild distress Lungs: clear to auscultation bilaterally Heart: regular rate and rhythm Abdomen: soft, non-tender; bowel sounds  normal Extremities: Homans sign is negative, no sign of DVT DTR's 2+ Presentation: cephalic Fetal monitoring  Baseline: 145 bpm; mod var, present acel, neg decels; irregular contractions.  Uterine activity  Date/time of onset: 0600 on 9/7 Dilation: 1 Effacement (%): Thick Exam by:: K. Kooistra CNM and H. Mathews Robinsons CNM   Prenatal labs: ABO, Rh: O/Positive/-- (02/26 1131) Antibody: Negative (02/26 1131) Rubella: 2.93 (02/26 1131) RPR: Non Reactive (06/27 1150)  HBsAg: Negative (02/26 1131)  HIV: Non Reactive (06/27 1150)  GBS: Negative (08/28 1500)  1 hr Glucola passed Genetic screening  neg Anatomy US normal  Prenatal Transfer Tool  Maternal Diabetes: No Genetic Screening: Normal Maternal Ultrasounds/Referrals: Normal Fetal Ultrasounds or other Referrals:  None Maternal Substance Abuse:  No Significant Maternal Medications:  Meds include: Other: acid reflux and nausea meds Significant Maternal Lab Results: None     Results for orders placed or performed during the hospital encounter of 08/08/18 (from the past 24 hour(s))  POCT fern test   Collection Time: 08/08/18  8:25 AM  Result Value Ref Range   POCT Fern Test Positive = ruptured amniotic membanes     Assessment: Ashley Robles is a 27 y.o. G1P0 with an IUP at [redacted]w[redacted]d presenting for SOL, SROM and vaginal bleeding.   Patient now with spotting only in MAU; grossly ruptured; US showed no signs of abruption.  Plan: #Labor: expectant management #Pain:  Per request #FWB Cat 1 #ID: GBS: neg #MOF:  breast #MOC: none/undecided #Circ: yes, outpatient Report given to Labor Team; will await transfer to [facility] when room becomes available.   Charlesetta Garibaldi Kooistra 08/08/2018, 8:32 AM

## 2018-08-08 NOTE — Anesthesia Pain Management Evaluation Note (Signed)
  CRNA Pain Management Visit Note  Patient: Ashley Robles, 27 y.o., female  "Hello I am a member of the anesthesia team at [facility]. We have an anesthesia team available at all times to provide care throughout the hospital, including epidural management and anesthesia for C-section. I don't know your plan for the delivery whether it a natural birth, water birth, IV sedation, nitrous supplementation, doula or epidural, but we want to meet your pain goals."   1.Was your pain managed to your expectations on prior hospitalizations?   No prior hospitalizations  2.What is your expectation for pain management during this hospitalization?     Epidural  3.How can we help you reach that goal? unsure  Record the patient's initial score and the patient's pain goal.   Pain: 0  Pain Goal: 8 [facility] wants you to be able to say your pain was always managed very well.  Cephus Shelling 08/08/2018

## 2018-08-08 NOTE — Progress Notes (Addendum)
Labor Progress Note Ashley Robles is a 27 y.o. G1P0 at [redacted]w[redacted]d presented for SROM S: Doing well. Pain controlled w/ epidural. Feeling more ant pelvic pressure.   O:  BP 131/81   Pulse (!) 105   Temp 98.9 F (37.2 C)   Resp 20   Ht 5\' 3"  (1.6 m)   Wt 79 kg   LMP 11/15/2017   SpO2 100%   BMI 30.85 kg/m  EFM: 170/min var/no accels, decels  CTX q1-1min  CVE: Dilation: 10 Dilation Complete Date: 08/08/18 Dilation Complete Time: 2230 Effacement (%): 100 Cervical Position: Posterior Station: Plus 2 Presentation: Vertex Exam by:: Dr Fara Boros   A&P: 27 y.o. G1P0 [redacted]w[redacted]d for SROM.  #Labor: Progressing well. S/p cytotec x1. Persistent tachysystole, s/p terbutaline x2. Complete, allow to labor down for an hour #Pain: well controlled w/ epidural #FWB: Cat 2 FHT. Likely 2/2 terbutaline. attempted to place IUPC for amnioinfusion but pt was complete. #GBS negative   Denzil Hughes, MD 11:15 PM

## 2018-08-08 NOTE — Anesthesia Preprocedure Evaluation (Signed)

## 2018-08-09 ENCOUNTER — Encounter (HOSPITAL_COMMUNITY): Payer: Self-pay | Admitting: *Deleted

## 2018-08-09 DIAGNOSIS — Z3A38 38 weeks gestation of pregnancy: Secondary | ICD-10-CM

## 2018-08-09 LAB — RPR: RPR: NONREACTIVE

## 2018-08-09 MED ORDER — DIPHENHYDRAMINE HCL 25 MG PO CAPS: 25.0000 mg | ORAL_CAPSULE | Freq: Four times a day (QID) | ORAL | Status: DC | PRN

## 2018-08-09 MED ORDER — SIMETHICONE 80 MG PO CHEW: 80.0000 mg | CHEWABLE_TABLET | ORAL | Status: DC | PRN

## 2018-08-09 MED ORDER — ONDANSETRON HCL 4 MG PO TABS: 4.0000 mg | ORAL_TABLET | ORAL | Status: DC | PRN

## 2018-08-09 MED ORDER — SENNOSIDES-DOCUSATE SODIUM 8.6-50 MG PO TABS
2.0000 | ORAL_TABLET | ORAL | Status: DC
  Administered 2018-08-09: 2 via ORAL
  Filled 2018-08-09: qty 2

## 2018-08-09 MED ORDER — IBUPROFEN 600 MG PO TABS
600.0000 mg | ORAL_TABLET | Freq: Four times a day (QID) | ORAL | Status: DC
  Administered 2018-08-09 – 2018-08-10 (×5): 600 mg via ORAL
  Filled 2018-08-09 (×6): qty 1

## 2018-08-09 MED ORDER — ZOLPIDEM TARTRATE 5 MG PO TABS: 5.0000 mg | ORAL_TABLET | Freq: Every evening | ORAL | Status: DC | PRN

## 2018-08-09 MED ORDER — DIBUCAINE 1 % RE OINT: 1.0000 "application " | TOPICAL_OINTMENT | RECTAL | Status: DC | PRN

## 2018-08-09 MED ORDER — COCONUT OIL OIL: 1.0000 "application " | TOPICAL_OIL | Status: DC | PRN

## 2018-08-09 MED ORDER — BENZOCAINE-MENTHOL 20-0.5 % EX AERO
1.0000 "application " | INHALATION_SPRAY | CUTANEOUS | Status: DC | PRN
  Administered 2018-08-09: 1 via TOPICAL
  Filled 2018-08-09: qty 56

## 2018-08-09 MED ORDER — ONDANSETRON HCL 4 MG/2ML IJ SOLN: 4.0000 mg | INTRAMUSCULAR | Status: DC | PRN

## 2018-08-09 MED ORDER — PRENATAL MULTIVITAMIN CH
1.0000 | ORAL_TABLET | Freq: Every day | ORAL | Status: DC
  Administered 2018-08-09 – 2018-08-10 (×2): 1 via ORAL
  Filled 2018-08-09 (×2): qty 1

## 2018-08-09 MED ORDER — ACETAMINOPHEN 325 MG PO TABS
650.0000 mg | ORAL_TABLET | ORAL | Status: DC | PRN
  Administered 2018-08-09: 650 mg via ORAL
  Filled 2018-08-09: qty 2

## 2018-08-09 MED ORDER — WITCH HAZEL-GLYCERIN EX PADS: 1.0000 "application " | MEDICATED_PAD | CUTANEOUS | Status: DC | PRN

## 2018-08-09 MED ORDER — TETANUS-DIPHTH-ACELL PERTUSSIS 5-2.5-18.5 LF-MCG/0.5 IM SUSP: 0.5000 mL | Freq: Once | INTRAMUSCULAR | Status: DC

## 2018-08-09 MED ORDER — MEASLES, MUMPS & RUBELLA VAC ~~LOC~~ INJ
0.5000 mL | INJECTION | Freq: Once | SUBCUTANEOUS | Status: DC
  Filled 2018-08-09: qty 0.5

## 2018-08-09 NOTE — Anesthesia Postprocedure Evaluation (Signed)
Anesthesia Post Note  Patient: Ashley Robles  Procedure(s) Performed: AN AD HOC LABOR EPIDURAL     Patient location during evaluation: Mother Baby Anesthesia Type: Epidural Level of consciousness: awake Pain management: satisfactory to patient Vital Signs Assessment: post-procedure vital signs reviewed and stable Respiratory status: spontaneous breathing Cardiovascular status: stable Anesthetic complications: no    Last Vitals:  Vitals:   08/09/18 1150 08/09/18 1505  BP: 128/76 91/73  Pulse: 84 96  Resp: 16 18  Temp: 37 C 36.6 C  SpO2: 100%     Last Pain:  Vitals:   08/09/18 1505  TempSrc: Oral  PainSc: Asleep   Pain Goal: Patients Stated Pain Goal: 3 (08/09/18 1415)               Cephus Shelling

## 2018-08-09 NOTE — Lactation Note (Signed)
This note was copied from a baby's chart. Lactation Consultation Note  Patient Name: Ashley Robles QAESL'P Date: 08/09/2018 Reason for consult: Initial assessment;Primapara;1st time breastfeeding;Early term 48-38.6wks  Baby is 16 hours old,  As LC entered the room, LC noted the baby to be fussy, LC checked diaper and changed a large wet  Diaper, while changing the diaper peed large amount 1/4 of the mattress.  LC cleaned baby up with wipes and assisted mom with latch in the lying position due to her being sore and lying on her side.  LC reviewed hand expressing, several drops of colostrum noted and at 1st baby sluggish, not showing many feeding cues. LC applied some drops on his lips and increased feeding cues noted, baby latched and fed for 10 mins, swallows noted and increased with breast compressions. Baby released / nipple well rounded/ mom trying to re-latch by allowing the baby to nibble onto the breast and baby didn't seem interested after feeding 10 mins. LC recommended to mom not to allow the baby to nibble onto the breast due to creating soreness.  LC reviewed feeding cues, and often if a baby has not stooled they are not overly hungry.  Benefits of STS and enhancing let down. Also that the baby's stomach is small, and gradually it stretches and they get hunger.  Bay has fed 5 x's in if life of 16 hours - 10 -12 min feedings and attempts, voided x 3, one mjec stool.  LC mentioned to  Mom  Baby has some stooling to do.  Mother informed of post-discharge support and given phone number to the lactation department, including services for phone call assistance; out-patient appointments; and breastfeeding support group. List of other breastfeeding resources in the community given in the handout. Encouraged mother to call for problems or concerns related to breastfeeding.   Maternal Data Has patient been taught Hand Expression?: Yes(several drops noted, had mom return demo ) Does the patient  have breastfeeding experience prior to this delivery?: No  Feeding Feeding Type: Breast Fed Length of feed: 10 min(several swallows noted )  LATCH Score Latch: Grasps breast easily, tongue down, lips flanged, rhythmical sucking.  Audible Swallowing: A few with stimulation  Type of Nipple: Everted at rest and after stimulation  Comfort (Breast/Nipple): Soft / non-tender  Hold (Positioning): Assistance needed to correctly position infant at breast and maintain latch.  LATCH Score: 8  Interventions Interventions: Breast feeding basics reviewed;Assisted with latch;Skin to skin;Breast massage;Hand express;Breast compression;Adjust position;Support pillows;Position options;Expressed milk  Lactation Tools Discussed/Used     Consult Status Consult Status: Follow-up Date: 08/10/18 Follow-up type: In-patient    Matilde Sprang Talasia Saulter 08/09/2018, 7:25 PM

## 2018-08-09 NOTE — Lactation Note (Signed)
This note was copied from a baby's chart. Lactation Consultation Note  Patient Name: Ashley Robles JOITG'P Date: 08/09/2018 Reason for consult: Other (Comment)(initial assessment attempted)  Initial visit at 13 hours of life. Mom is a P1 who had requested lactation assistance, but upon arrival of [facility], Mom had multiple visitors that had just arrived.  Breastfeeding brochure left at bedside.   [name] [facility] 08/09/2018, 4:34 PM

## 2018-08-09 NOTE — Progress Notes (Signed)
Called lab for rpr results. "Still pending"

## 2018-08-10 NOTE — Progress Notes (Addendum)
POSTPARTUM PROGRESS NOTE Post Partum Day 1  Subjective:  Ashley Robles is a 27 y.o. G1P1001 [redacted]w[redacted]d s/p VAVD.  No acute events overnight.  Pt denies problems with ambulating, voiding or po intake.  She denies nausea or vomiting.  Pain is well controlled.  She has had flatus. She has not had bowel movement.  Lochia Moderate.   Objective: Blood pressure 108/79, pulse 90, temperature 97.9 F (36.6 C), temperature source Oral, resp. rate 18, height 5\' 3"  (1.6 m), weight 79 kg, last menstrual period 11/15/2017, SpO2 100 %, unknown if currently breastfeeding.  Physical Exam:  General: alert, cooperative and no distress Lochia:normal flow Chest: CTAB Heart: RRR no m/r/g Abdomen: +BS, soft, nontender,  Uterine Fundus: firm, below umbilicus DVT Evaluation: No calf swelling or tenderness Extremities: no edema  Recent Labs    08/08/18 1016  HGB 13.6  HCT 40.1    Assessment/Plan:  ASSESSMENT: Ashley Robles is a 27 y.o. G1P1001 [redacted]w[redacted]d s/p VAVD  Plan for discharge tomorrow   LOS: 2 days   Mirian Mo, MD 08/10/2018, 10:45 AM   Clayton Bibles, CNM 08/10/18  1:05 PM

## 2018-08-10 NOTE — Discharge Summary (Addendum)
OB Discharge Summary     Patient Name: Ashley Robles DOB: 1991-04-04 MRN: [id number]  Date of admission: 08/08/2018 Delivering MD: Arvilla Market   Date of discharge: 08/10/2018  Admitting diagnosis: 38 WKS CTX Intrauterine pregnancy: [redacted]w[redacted]d     Secondary diagnosis:  Active Problems:   Indication for care in labor and delivery, antepartum  Additional problems: vacuum assisted vaginal delivery     Discharge diagnosis: Term Pregnancy Delivered                                                                                                Post partum procedures:none  Augmentation: none  Complications: None  Hospital course:  Onset of Labor With Vaginal Delivery     27 y.o. yo G1P1001 at [redacted]w[redacted]d was admitted in Active Labor on 08/08/2018. Patient had an uncomplicated labor course as follows:  Membrane Rupture Time/Date: 7:15 AM ,08/08/2018   Intrapartum Procedures: Episiotomy: Median [2]                                         Lacerations:  2nd degree [3]  Patient had a delivery of a Viable infant. 08/09/2018  Information for the patient's newborn:  Janeen, Watson [[id number]]       Pateint had an uncomplicated postpartum course.  She is ambulating, tolerating a regular diet, passing flatus, and urinating well. Patient is discharged home in stable condition on 08/10/18.   Physical exam  Vitals:   08/09/18 1150 08/09/18 1505 08/09/18 1923 08/10/18 0537  BP: 128/76 91/73 120/82 108/79  Pulse: 84 96 98 90  Resp: 16 18 16 18   Temp: 98.6 F (37 C) 97.8 F (36.6 C) 97.7 F (36.5 C) 97.9 F (36.6 C)  TempSrc: Oral Oral Oral Oral  SpO2: 100%  100%   Weight:      Height:       General: alert, cooperative and no distress Lochia: appropriate Uterine Fundus: firm Incision: N/A DVT Evaluation: No evidence of DVT seen on physical exam. Labs: Lab Results  Component Value Date   WBC 11.9 (H) 08/08/2018   HGB 13.6 08/08/2018   HCT 40.1 08/08/2018   MCV 90.1 08/08/2018    PLT 255 08/08/2018   No flowsheet data found.  Discharge instruction: per After Visit Summary and "Baby and Me Booklet".  After visit meds:  Allergies as of 08/10/2018      Reactions   Sesame Seed Extract Allergy Skin Test Itching      Medication List    TAKE these medications   Doxylamine-Pyridoxine 10-10 MG Tbec Take 1 tablet with breakfast and lunch.  Take 2 tablets at bedtime.   omeprazole 20 MG capsule Commonly known as:  PRILOSEC Take 1 capsule (20 mg total) by mouth 2 (two) times daily before a meal.   VITAFOL-NANO 18-0.6-0.4 MG Tabs Take 1 tablet by mouth daily.       Diet: routine diet  Activity: Advance as tolerated. Pelvic rest for 6 weeks.   Outpatient follow  up:4 weeks Follow up Appt: Future Appointments  Date Time Provider Department Center  09/07/2018 10:45 AM Constant, Gigi Gin, MD [facility] None   Follow up Visit:No follow-ups on file.  Postpartum contraception: Undecided  Newborn Data: Live born female  Birth Weight: 8 lb (3629 g) APGAR: 7, 8  Newborn Delivery   Birth date/time:  08/09/2018 02:50:00 Delivery type:  Vaginal, Vacuum (Extractor)     Baby Feeding: Breast Disposition:home with mother   08/10/2018 Mirian Mo, MD  I confirm that I have verified the information documented in the resident's note and that I have also personally reperformed the physical exam and all medical decision making activities.  Lelon Mast Weinhold,CNM 08/10/18  4:17 PM

## 2018-08-10 NOTE — Lactation Note (Signed)
This note was copied from a baby's chart. Lactation Consultation Note  Patient Name: Boy Kamalei Ognibene SAYTK'Z Date: 08/10/2018 Reason for consult: Follow-up assessment;Difficult latch;Primapara;1st time breastfeeding;Early term 37-38.6wks  P1 mother whose infant is now 69 hours old. Family is awaiting discharge.  Mother was going to breastfeed as I entered.  I offered to assist with latching and she accepted.  Mother stated that baby has been sleepy and not wanting to feed well.   Mother's breasts are soft and non tender with everted nipples bilaterally.  Mother wanted to feed in the chair so I assisted her to a comfortable position for the football hold.  Assisted baby to latch and he began rhythmic sucking immediately.  Lips flanged and mother had no pain.  Mother had strong uterine contractions with sucking.  She stated "this is the best he has ever done!"  I observed him feeding for 20 minutes while we spoke.  I reviewed milk CTV, feeding cues, hand expression, engorgement prevention/treatment and how to awaken a sleepy baby.  Also provided a manual pump with instructions for use.    Mother will eventually obtain a DEBP for home use.  She will be a stay at home mother for now.  Mother had a female support person in the room with her who will be of great assistance as needed at home.  Encouraged mother to call me for any questions/concerns prior to discharge. RN updated.   Maternal Data Formula Feeding for Exclusion: No Has patient been taught Hand Expression?: Yes Does the patient have breastfeeding experience prior to this delivery?: No  Feeding Feeding Type: Breast Fed Length of feed: 20 min(still feeding when I left the room)  LATCH Score Latch: Grasps breast easily, tongue down, lips flanged, rhythmical sucking.  Audible Swallowing: Spontaneous and intermittent  Type of Nipple: Everted at rest and after stimulation  Comfort (Breast/Nipple): Soft / non-tender  Hold  (Positioning): Assistance needed to correctly position infant at breast and maintain latch.  LATCH Score: 9  Interventions Interventions: Breast feeding basics reviewed;Assisted with latch;Skin to skin;Breast massage;Hand express;Position options;Support pillows;Adjust position;Breast compression;Hand pump  Lactation Tools Discussed/Used WIC Program: No   Consult Status Consult Status: Follow-up Date: 08/11/18 Follow-up type: In-patient    Jayln Madeira R Carnita Golob 08/10/2018, 11:06 AM

## 2018-08-10 NOTE — Discharge Instructions (Signed)
Vaginal Delivery, Care After °Refer to this sheet in the next few weeks. These instructions provide you with information about caring for yourself after vaginal delivery. Your health care provider may also give you more specific instructions. Your treatment has been planned according to current medical practices, but problems sometimes occur. Call your health care provider if you have any problems or questions. °What can I expect after the procedure? °After vaginal delivery, it is common to have: °· Some bleeding from your vagina. °· Soreness in your abdomen, your vagina, and the area of skin between your vaginal opening and your anus (perineum). °· Pelvic cramps. °· Fatigue. ° °Follow these instructions at home: °Medicines °· Take over-the-counter and prescription medicines only as told by your health care provider. °· If you were prescribed an antibiotic medicine, take it as told by your health care provider. Do not stop taking the antibiotic until it is finished. °Driving ° °· Do not drive or operate heavy machinery while taking prescription pain medicine. °· Do not drive for 24 hours if you received a sedative. °Lifestyle °· Do not drink alcohol. This is especially important if you are breastfeeding or taking medicine to relieve pain. °· Do not use tobacco products, including cigarettes, chewing tobacco, or e-cigarettes. If you need help quitting, ask your health care provider. °Eating and drinking °· Drink at least 8 eight-ounce glasses of water every day unless you are told not to by your health care provider. If you choose to breastfeed your baby, you may need to drink more water than this. °· Eat high-fiber foods every day. These foods may help prevent or relieve constipation. High-fiber foods include: °? Whole grain cereals and breads. °? Brown rice. °? Beans. °? Fresh fruits and vegetables. °Activity °· Return to your normal activities as told by your health care provider. Ask your health care provider  what activities are safe for you. °· Rest as much as possible. Try to rest or take a nap when your baby is sleeping. °· Do not lift anything that is heavier than your baby or 10 lb (4.5 kg) until your health care provider says that it is safe. °· Talk with your health care provider about when you can engage in sexual activity. This may depend on your: °? Risk of infection. °? Rate of healing. °? Comfort and desire to engage in sexual activity. °Vaginal Care °· If you have an episiotomy or a vaginal tear, check the area every day for signs of infection. Check for: °? More redness, swelling, or pain. °? More fluid or blood. °? Warmth. °? Pus or a bad smell. °· Do not use tampons or douches until your health care provider says this is safe. °· Watch for any blood clots that may pass from your vagina. These may look like clumps of dark red, brown, or black discharge. °General instructions °· Keep your perineum clean and dry as told by your health care provider. °· Wear loose, comfortable clothing. °· Wipe from front to back when you use the toilet. °· Ask your health care provider if you can shower or take a bath. If you had an episiotomy or a perineal tear during labor and delivery, your health care provider may tell you not to take baths for a certain length of time. °· Wear a bra that supports your breasts and fits you well. °· If possible, have someone help you with household activities and help care for your baby for at least a few days after   you leave the hospital. °· Keep all follow-up visits for you and your baby as told by your health care provider. This is important. °Contact a health care provider if: °· You have: °? Vaginal discharge that has a bad smell. °? Difficulty urinating. °? Pain when urinating. °? A sudden increase or decrease in the frequency of your bowel movements. °? More redness, swelling, or pain around your episiotomy or vaginal tear. °? More fluid or blood coming from your episiotomy or  vaginal tear. °? Pus or a bad smell coming from your episiotomy or vaginal tear. °? A fever. °? A rash. °? Little or no interest in activities you used to enjoy. °? Questions about caring for yourself or your baby. °· Your episiotomy or vaginal tear feels warm to the touch. °· Your episiotomy or vaginal tear is separating or does not appear to be healing. °· Your breasts are painful, hard, or turn red. °· You feel unusually sad or worried. °· You feel nauseous or you vomit. °· You pass large blood clots from your vagina. If you pass a blood clot from your vagina, save it to show to your health care provider. Do not flush blood clots down the toilet without having your health care provider look at them. °· You urinate more than usual. °· You are dizzy or light-headed. °· You have not breastfed at all and you have not had a menstrual period for 12 weeks after delivery. °· You have stopped breastfeeding and you have not had a menstrual period for 12 weeks after you stopped breastfeeding. °Get help right away if: °· You have: °? Pain that does not go away or does not get better with medicine. °? Chest pain. °? Difficulty breathing. °? Blurred vision or spots in your vision. °? Thoughts about hurting yourself or your baby. °· You develop pain in your abdomen or in one of your legs. °· You develop a severe headache. °· You faint. °· You bleed from your vagina so much that you fill two sanitary pads in one hour. °This information is not intended to replace advice given to you by your health care provider. Make sure you discuss any questions you have with your health care provider. °Document Released: 11/15/2000 Document Revised: 05/01/2016 Document Reviewed: 12/03/2015 °Elsevier Interactive Patient Education © 2018 Else[employer] Inc. ° °

## 2018-08-12 ENCOUNTER — Encounter: Payer: Medicaid Other | Admitting: Obstetrics

## 2018-08-26 ENCOUNTER — Ambulatory Visit: Payer: Medicaid Other | Admitting: Nurse Practitioner

## 2018-09-01 ENCOUNTER — Encounter: Payer: Self-pay | Admitting: Obstetrics & Gynecology

## 2018-09-01 ENCOUNTER — Ambulatory Visit (INDEPENDENT_AMBULATORY_CARE_PROVIDER_SITE_OTHER): Payer: Medicaid Other | Admitting: Obstetrics & Gynecology

## 2018-09-01 DIAGNOSIS — Z1389 Encounter for screening for other disorder: Secondary | ICD-10-CM

## 2018-09-01 NOTE — Patient Instructions (Signed)
Intrauterine Device Information  An intrauterine device (IUD) is inserted into your uterus to prevent pregnancy. There are two types of IUDs available:  · Copper IUD--This type of IUD is wrapped in copper wire and is placed inside the uterus. Copper makes the uterus and fallopian tubes produce a fluid that kills sperm. The copper IUD can stay in place for 10 years.  · Hormone IUD--This type of IUD contains the hormone progestin (synthetic progesterone). The hormone thickens the cervical mucus and prevents sperm from entering the uterus. It also thins the uterine lining to prevent implantation of a fertilized egg. The hormone can weaken or kill the sperm that get into the uterus. One type of hormone IUD can stay in place for 5 years, and another type can stay in place for 3 years.    Your health care provider will make sure you are a good candidate for a contraceptive IUD. Discuss with your health care provider the possible side effects.  Advantages of an intrauterine device  · IUDs are highly effective, reversible, long acting, and low maintenance.  · There are no estrogen-related side effects.  · An IUD can be used when breastfeeding.  · IUDs are not associated with weight gain.  · The copper IUD works immediately after insertion.  · The hormone IUD works right away if inserted within 7 days of your period starting. You will need to use a backup method of birth control for 7 days if the hormone IUD is inserted at any other time in your cycle.  · The copper IUD does not interfere with your female hormones.  · The hormone IUD can make heavy menstrual periods lighter and decrease cramping.  · The hormone IUD can be used for 3 or 5 years.  · The copper IUD can be used for 10 years.  Disadvantages of an intrauterine device  · The hormone IUD can be associated with irregular bleeding patterns.  · The copper IUD can make your menstrual flow heavier and more painful.  · You may experience cramping and vaginal bleeding  after insertion.  This information is not intended to replace advice given to you by your health care provider. Make sure you discuss any questions you have with your health care provider.  Document Released: 10/22/2004 Document Revised: 04/25/2016 Document Reviewed: 05/09/2013  Elsevier Interactive Patient Education © 2017 Else[employer] Inc.  Levonorgestrel intrauterine device (IUD)  What is this medicine?  LEVONORGESTREL IUD (LEE voe nor jes trel) is a contraceptive (birth control) device. The device is placed inside the uterus by a healthcare professional. It is used to prevent pregnancy. This device can also be used to treat heavy bleeding that occurs during your period.  This medicine may be used for other purposes; ask your health care provider or pharmacist if you have questions.  COMMON BRAND NAME(S): Kyleena, LILETTA, Mirena, Skyla  What should I tell my health care provider before I take this medicine?  They need to know if you have any of these conditions:  -abnormal Pap smear  -cancer of the breast, uterus, or cervix  -diabetes  -endometritis  -genital or pelvic infection now or in the past  -have more than one sexual partner or your partner has more than one partner  -heart disease  -history of an ectopic or tubal pregnancy  -immune system problems  -IUD in place  -liver disease or tumor  -problems with blood clots or take blood-thinners  -seizures  -use intravenous drugs  -uterus of unusual   shape  -vaginal bleeding that has not been explained  -an unusual or allergic reaction to levonorgestrel, other hormones, silicone, or polyethylene, medicines, foods, dyes, or preservatives  -pregnant or trying to get pregnant  -breast-feeding  How should I use this medicine?  This device is placed inside the uterus by a health care professional.  Talk to your pediatrician regarding the use of this medicine in children. Special care may be needed.  Overdosage: If you think you have taken too much of this medicine contact  a poison control center or emergency room at once.  NOTE: This medicine is only for you. Do not share this medicine with others.  What if I miss a dose?  This does not apply. Depending on the brand of device you have inserted, the device will need to be replaced every 3 to 5 years if you wish to continue using this type of birth control.  What may interact with this medicine?  Do not take this medicine with any of the following medications:  -amprenavir  -bosentan  -fosamprenavir  This medicine may also interact with the following medications:  -aprepitant  -armodafinil  -barbiturate medicines for inducing sleep or treating seizures  -bexarotene  -boceprevir  -griseofulvin  -medicines to treat seizures like carbamazepine, ethotoin, felbamate, oxcarbazepine, phenytoin, topiramate  -modafinil  -pioglitazone  -rifabutin  -rifampin  -rifapentine  -some medicines to treat HIV infection like atazanavir, efavirenz, indinavir, lopinavir, nelfinavir, tipranavir, ritonavir  -St. John's wort  -warfarin  This list may not describe all possible interactions. Give your health care provider a list of all the medicines, herbs, non-prescription drugs, or dietary supplements you use. Also tell them if you smoke, drink alcohol, or use illegal drugs. Some items may interact with your medicine.  What should I watch for while using this medicine?  Visit your doctor or health care professional for regular check ups. See your doctor if you or your partner has sexual contact with others, becomes HIV positive, or gets a sexual transmitted disease.  This product does not protect you against HIV infection (AIDS) or other sexually transmitted diseases.  You can check the placement of the IUD yourself by reaching up to the top of your vagina with clean fingers to feel the threads. Do not pull on the threads. It is a good habit to check placement after each menstrual period. Call your doctor right away if you feel more of the IUD than just the  threads or if you cannot feel the threads at all.  The IUD may come out by itself. You may become pregnant if the device comes out. If you notice that the IUD has come out use a backup birth control method like condoms and call your health care provider.  Using tampons will not change the position of the IUD and are okay to use during your period.  This IUD can be safely scanned with magnetic resonance imaging (MRI) only under specific conditions. Before you have an MRI, tell your healthcare provider that you have an IUD in place, and which type of IUD you have in place.  What side effects may I notice from receiving this medicine?  Side effects that you should report to your doctor or health care professional as soon as possible:  -allergic reactions like skin rash, itching or hives, swelling of the face, lips, or tongue  -fever, flu-like symptoms  -genital sores  -high blood pressure  -no menstrual period for 6 weeks during use  -pain, swelling, warmth   in the leg  -pelvic pain or tenderness  -severe or sudden headache  -signs of pregnancy  -stomach cramping  -sudden shortness of breath  -trouble with balance, talking, or walking  -unusual vaginal bleeding, discharge  -yellowing of the eyes or skin  Side effects that usually do not require medical attention (report to your doctor or health care professional if they continue or are bothersome):  -acne  -breast pain  -change in sex drive or performance  -changes in weight  -cramping, dizziness, or faintness while the device is being inserted  -headache  -irregular menstrual bleeding within first 3 to 6 months of use  -nausea  This list may not describe all possible side effects. Call your doctor for medical advice about side effects. You may report side effects to FDA at 1-800-FDA-1088.  Where should I keep my medicine?  This does not apply.  NOTE: This sheet is a summary. It may not cover all possible information. If you have questions about this medicine, talk to  your doctor, pharmacist, or health care provider.  © 2018 Elsevier/Gold Standard (2016-08-30 14:14:56)

## 2018-09-01 NOTE — Progress Notes (Signed)
Pt wants to discuss possibly getting IUD. Marland Kitchen.Post Partum Exam  Ashley Robles is a 27 y.o. G49P1001 female who presents for a postpartum visit. She is 3 weeks postpartum following a spontaneous vaginal delivery. I have fully reviewed the prenatal and intrapartum course. The delivery was at 38.0 gestational weeks.  Anesthesia: epidural. Postpartum course has been good. Baby's course has been good. Baby is feeding by both breast and bottle - Similac Advance. Bleeding no bleeding. Bowel function is normal. Bladder function is normal. Patient is not sexually active. Contraception method is none. Postpartum depression screening:neg  The following portions of the patient's history were reviewed and updated as appropriate: allergies, current medications, past family history, past medical history, past social history, past surgical history and problem list. Last pap smear done 01/27/2018 and was Normal  Review of Systems Pertinent items are noted in HPI.    Objective:  BP 108/73   Pulse 67   Wt 149 lb 12.8 oz (67.9 kg)   Breastfeeding? Yes   BMI 26.54 kg/m    CONSTITUTIONAL: Well-developed, well-nourished female in no acute distress.  HENT:  Normocephalic, atraumatic EYES: Conjunctivae and EOM are normal. No scleral icterus.  NECK: Normal range of motion SKIN: Skin is warm and dry. No rash noted. Not diaphoretic.No pallor. NEUROLGIC: Alert and oriented to person, place, and time. Normal coordination.  GU: EGBUS: no lesions Vagina/perineum: healing well.  bimanual not done     Assessment:    3 weeks postpartum exam. Pap smear not done at today's visit.   Plan:   1. Contraception: none 2. Interested in an IUD reviewed copper T vs LnIUD pt will decide befreo next visit and get at that time 3. Follow up in: 2-3 weeks or as needed.   Curtis Uriarte L. Harraway-Smith, M.D., [facility]

## 2018-09-07 ENCOUNTER — Ambulatory Visit: Payer: Medicaid Other | Admitting: Obstetrics and Gynecology

## 2018-09-17 ENCOUNTER — Ambulatory Visit (INDEPENDENT_AMBULATORY_CARE_PROVIDER_SITE_OTHER): Payer: Medicaid Other | Admitting: Obstetrics and Gynecology

## 2018-09-17 ENCOUNTER — Encounter: Payer: Self-pay | Admitting: Obstetrics and Gynecology

## 2018-09-17 VITALS — BP 102/68 | HR 75 | Wt 151.3 lb

## 2018-09-17 DIAGNOSIS — R102 Pelvic and perineal pain: Secondary | ICD-10-CM

## 2018-09-17 MED ORDER — IBUPROFEN 600 MG PO TABS: 600.0000 mg | ORAL_TABLET | Freq: Four times a day (QID) | ORAL | 1 refills | Status: AC | PRN

## 2018-09-17 NOTE — Progress Notes (Signed)
Patient reports that stitches felt healed at pp visit on 09-01-18, but at the end of last week she began to feel vaginal irritation, slight discharge, no odor. Denies being sexually active.

## 2018-09-17 NOTE — Progress Notes (Signed)
   GYNECOLOGY OFFICE VISIT NOTE  History:  27 y.o. G1P1001 here today for perineal pain, she is s/p SVD with 2nd degree laceration and repair on 08/09/18. States she can feel stitches now and is having some discomfort, denies discharge or significant pain.  Past Medical History:  Diagnosis Date  . Anemia   . Arthritis   . Vitamin D deficiency     Past Surgical History:  Procedure Laterality Date  . NO PAST SURGERIES       Current Outpatient Medications:  .  Doxylamine-Pyridoxine (DICLEGIS) 10-10 MG TBEC, Take 1 tablet with breakfast and lunch.  Take 2 tablets at bedtime. (Patient not taking: Reported on 08/08/2018), Disp: 100 tablet, Rfl: 4 .  ibuprofen (ADVIL,MOTRIN) 600 MG tablet, Take 1 tablet (600 mg total) by mouth every 6 (six) hours as needed., Disp: 30 tablet, Rfl: 1 .  omeprazole (PRILOSEC) 20 MG capsule, Take 1 capsule (20 mg total) by mouth 2 (two) times daily before a meal. (Patient not taking: Reported on 09/01/2018), Disp: 60 capsule, Rfl: 5 .  Prenatal-Fe Fum-Methf-FA w/o A (VITAFOL-NANO) 18-0.6-0.4 MG TABS, Take 1 tablet by mouth daily. (Patient not taking: Reported on 09/01/2018), Disp: 30 tablet, Rfl: 12  The following portions of the patient's history were reviewed and updated as appropriate: allergies, current medications, past family history, past medical history, past social history, past surgical history and problem list.   Review of Systems:  Pertinent items noted in HPI and remainder of comprehensive ROS otherwise negative.   Objective:  Physical Exam BP 102/68   Pulse 75   Wt 151 lb 4.8 oz (68.6 kg)   Breastfeeding? Yes   BMI 26.80 kg/m  CONSTITUTIONAL: Well-developed, well-nourished female in no acute distress.  HENT:  Normocephalic, atraumatic. External right and left ear normal. Oropharynx is clear and moist EYES: Conjunctivae and EOM are normal. Pupils are equal, round, and reactive to light. No scleral icterus.  NECK: Normal range of motion, supple,  no masses SKIN: Skin is warm and dry. No rash noted. Not diaphoretic. No erythema. No pallor. NEUROLOGIC: Alert and oriented to person, place, and time. Normal reflexes, muscle tone coordination. No cranial nerve deficit noted. PSYCHIATRIC: Normal mood and affect. Normal behavior. Normal judgment and thought content. CARDIOVASCULAR: Normal heart rate noted RESPIRATORY: Effort normal, no problems with respiration noted ABDOMEN: Soft, no distention noted.   PELVIC: Normal appearing external genitalia with healing perineal laceration, some granulation tissue at the perineum and just promixal to hymenal ring on right posterior introitus, no swelling, induration, pus, no defect in the vaginal wall. MUSCULOSKELETAL: Normal range of motion. No edema noted.  Labs and Imaging No results found.  Assessment & Plan:  1. Perineal pain No evidence of infection, likely inflammation with some granulation tissue at introitus and on perineum. Appears to otherwise be healing well. Hurricane spray applied and granulation tissue cauterized, patient tolerated fairly well - to take motrin for 5 days to suppress inflammation and return if worsens  2. Perineal laceration during delivery, postpartum condition   Routine preventative health maintenance measures emphasized. Please refer to After Visit Summary for other counseling recommendations.   Return in about 2 weeks (around 10/01/2018).    Baldemar Lenis, M.D. [facility]

## 2018-09-22 ENCOUNTER — Ambulatory Visit: Payer: Medicaid Other | Admitting: Obstetrics and Gynecology

## 2018-10-01 ENCOUNTER — Ambulatory Visit: Payer: Medicaid Other | Admitting: Certified Nurse Midwife

## 2018-12-09 ENCOUNTER — Ambulatory Visit: Payer: Medicaid Other | Admitting: Obstetrics & Gynecology

## 2019-04-13 ENCOUNTER — Telehealth: Payer: Self-pay | Admitting: *Deleted

## 2019-04-13 NOTE — Telephone Encounter (Signed)
Pt called to ask if she could take Vit D while breastfeeding. Attempt to contact pt.  LM on VM making her aware that she make take supplement.

## 2020-01-30 ENCOUNTER — Emergency Department (HOSPITAL_COMMUNITY)
Admission: EM | Admit: 2020-01-30 | Discharge: 2020-01-30 | Disposition: A | Discharge status: Home / Self Care | Payer: Medicaid Other | Attending: Emergency Medicine | Admitting: Emergency Medicine

## 2020-01-30 ENCOUNTER — Other Ambulatory Visit: Payer: Self-pay

## 2020-01-30 ENCOUNTER — Encounter (HOSPITAL_COMMUNITY): Payer: Self-pay | Admitting: Emergency Medicine

## 2020-01-30 DIAGNOSIS — R109 Unspecified abdominal pain: Secondary | ICD-10-CM | POA: Insufficient documentation

## 2020-01-30 DIAGNOSIS — R350 Frequency of micturition: Secondary | ICD-10-CM | POA: Insufficient documentation

## 2020-01-30 DIAGNOSIS — R197 Diarrhea, unspecified: Secondary | ICD-10-CM | POA: Insufficient documentation

## 2020-01-30 DIAGNOSIS — R112 Nausea with vomiting, unspecified: Secondary | ICD-10-CM

## 2020-01-30 LAB — URINALYSIS, ROUTINE W REFLEX MICROSCOPIC
Bilirubin Urine: NEGATIVE
Glucose, UA: NEGATIVE mg/dL
Hgb urine dipstick: NEGATIVE
Ketones, ur: NEGATIVE mg/dL
Leukocytes,Ua: NEGATIVE
Nitrite: NEGATIVE
Protein, ur: NEGATIVE mg/dL
Specific Gravity, Urine: 1.021 (ref 1.005–1.030)
pH: 5 (ref 5.0–8.0)

## 2020-01-30 LAB — CBC
HCT: 41 % (ref 36.0–46.0)
Hemoglobin: 13.6 g/dL (ref 12.0–15.0)
MCH: 28.3 pg (ref 26.0–34.0)
MCHC: 33.2 g/dL (ref 30.0–36.0)
MCV: 85.4 fL (ref 80.0–100.0)
Platelets: 301 10*3/uL (ref 150–400)
RBC: 4.8 MIL/uL (ref 3.87–5.11)
RDW: 12.7 % (ref 11.5–15.5)
WBC: 8.5 10*3/uL (ref 4.0–10.5)
nRBC: 0 % (ref 0.0–0.2)

## 2020-01-30 LAB — COMPREHENSIVE METABOLIC PANEL
ALT: 17 U/L (ref 0–44)
AST: 16 U/L (ref 15–41)
Albumin: 4.1 g/dL (ref 3.5–5.0)
Alkaline Phosphatase: 43 U/L (ref 38–126)
Anion gap: 11 (ref 5–15)
BUN: 16 mg/dL (ref 6–20)
CO2: 23 mmol/L (ref 22–32)
Calcium: 9.4 mg/dL (ref 8.9–10.3)
Chloride: 102 mmol/L (ref 98–111)
Creatinine, Ser: 0.74 mg/dL (ref 0.44–1.00)
GFR calc Af Amer: >60 mL/min (ref >60)
GFR calc non Af Amer: >60 mL/min (ref >60)
Glucose, Bld: 120 mg/dL — ABNORMAL HIGH (ref 70–99)
Potassium: 3.4 mmol/L — ABNORMAL LOW (ref 3.5–5.1)
Sodium: 136 mmol/L (ref 135–145)
Total Bilirubin: 0.6 mg/dL (ref 0.3–1.2)
Total Protein: 7.8 g/dL (ref 6.5–8.1)

## 2020-01-30 LAB — I-STAT BETA HCG BLOOD, ED (MC, WL, AP ONLY): I-stat hCG, quantitative: <5 m[IU]/mL (ref <5)

## 2020-01-30 LAB — LIPASE, BLOOD: Lipase: 33 U/L (ref 11–51)

## 2020-01-30 MED ORDER — ALUM & MAG HYDROXIDE-SIMETH 200-200-20 MG/5ML PO SUSP
30.0000 mL | Freq: Once | ORAL | Status: AC
  Administered 2020-01-30: 06:00:00 30 mL via ORAL
  Filled 2020-01-30: qty 30

## 2020-01-30 MED ORDER — LOPERAMIDE HCL 2 MG PO CAPS: 2.0000 mg | ORAL_CAPSULE | Freq: Four times a day (QID) | ORAL | 0 refills | Status: AC | PRN

## 2020-01-30 MED ORDER — ONDANSETRON 4 MG PO TBDP: 4.0000 mg | ORAL_TABLET | Freq: Three times a day (TID) | ORAL | 0 refills | Status: AC | PRN

## 2020-01-30 MED ORDER — SODIUM CHLORIDE 0.9% FLUSH: 3.0000 mL | Freq: Once | INTRAVENOUS | Status: DC

## 2020-01-30 MED ORDER — DICYCLOMINE HCL 10 MG PO CAPS
10.0000 mg | ORAL_CAPSULE | Freq: Once | ORAL | Status: AC
  Administered 2020-01-30: 06:00:00 10 mg via ORAL
  Filled 2020-01-30: qty 1

## 2020-01-30 MED ORDER — ONDANSETRON 4 MG PO TBDP
4.0000 mg | ORAL_TABLET | Freq: Once | ORAL | Status: AC
  Administered 2020-01-30: 04:00:00 4 mg via ORAL
  Filled 2020-01-30: qty 1

## 2020-01-30 MED ORDER — LIDOCAINE VISCOUS HCL 2 % MT SOLN
15.0000 mL | Freq: Once | OROMUCOSAL | Status: AC
  Administered 2020-01-30: 06:00:00 15 mL via ORAL
  Filled 2020-01-30: qty 15

## 2020-01-30 NOTE — ED Provider Notes (Signed)
[facility] EMERGENCY DEPARTMENT Provider Note   CSN: [id number] Arrival date & time: 01/30/20  0120     History Chief Complaint  Patient presents with  . Abdominal Pain    Ashley Robles is a 29 y.o. female.  HPI     This is a 29 year old female who presents with abdominal pain, nausea, and diarrhea.  Patient reports that she had acute onset of symptoms around midnight.  She developed pain across her back that radiated into her abdomen.  It does not lateralize but was across her whole back.  She reports nonbloody diarrhea and nausea since that time.  Currently she states that her pain has gone away.  She denies any urinary symptoms or hematuria.  Denies any history of kidney stones.  She is unsure whether she may be pregnant.  She denies any recent fevers or cough.  No known sick contacts or Covid exposures.  Past Medical History:  Diagnosis Date  . Anemia   . Arthritis   . Vitamin D deficiency     Patient Active Problem List   Diagnosis Date Noted  . Indication for care in labor and delivery, antepartum 08/08/2018  . Supervision of normal first pregnancy, antepartum 01/27/2018  . Nausea/vomiting in pregnancy 01/27/2018    Past Surgical History:  Procedure Laterality Date  . NO PAST SURGERIES       OB History    Gravida  1   Para  1   Term  1   Preterm      AB      Living  1     SAB      TAB      Ectopic      Multiple  0   Live Births  1           Family History  Problem Relation Age of Onset  . Diabetes Mother   . Thyroid disease Mother   . Diabetes Father   . Alzheimer's disease Maternal Grandfather     Social History   Tobacco Use  . Smoking status: Never Smoker  . Smokeless tobacco: Never Used  Substance Use Topics  . Alcohol use: No  . Drug use: No    Home Medications Prior to Admission medications   Medication Sig Start Date End Date Taking? Authorizing Provider  Doxylamine-Pyridoxine (DICLEGIS) 10-10  MG TBEC Take 1 tablet with breakfast and lunch.  Take 2 tablets at bedtime. Patient not taking: Reported on 08/08/2018 01/27/18   Orvilla Cornwall A, CNM  ibuprofen (ADVIL,MOTRIN) 600 MG tablet Take 1 tablet (600 mg total) by mouth every 6 (six) hours as needed. 09/17/18   Conan Bowens, MD  loperamide (IMODIUM) 2 MG capsule Take 1 capsule (2 mg total) by mouth 4 (four) times daily as needed for diarrhea or loose stools. 01/30/20   Arthella Headings, Mayer Masker, MD  omeprazole (PRILOSEC) 20 MG capsule Take 1 capsule (20 mg total) by mouth 2 (two) times daily before a meal. Patient not taking: Reported on 09/01/2018 04/06/18   Orvilla Cornwall A, CNM  ondansetron (ZOFRAN ODT) 4 MG disintegrating tablet Take 1 tablet (4 mg total) by mouth every 8 (eight) hours as needed for nausea or vomiting. 01/30/20   Noreene Boreman, Mayer Masker, MD  Prenatal-Fe Fum-Methf-FA w/o A (VITAFOL-NANO) 18-0.6-0.4 MG TABS Take 1 tablet by mouth daily. Patient not taking: Reported on 09/01/2018 01/27/18   Orvilla Cornwall A, CNM    Allergies    Sesame seed extract allergy skin test  Review of Systems   Review of Systems  Constitutional: Negative for fever.  Respiratory: Negative for shortness of breath.   Cardiovascular: Negative for chest pain.  Gastrointestinal: Positive for abdominal pain, diarrhea and nausea. Negative for constipation and vomiting.  Genitourinary: Positive for frequency. Negative for dysuria and hematuria.  All other systems reviewed and are negative.   Physical Exam Updated Vital Signs BP 113/68   Pulse 73   Temp 97.9 F (36.6 C) (Oral)   Resp 15   Ht 1.6 m (5\' 3" )   Wt 65 kg   LMP 12/16/2019 (Approximate)   SpO2 99%   BMI 25.38 kg/m   Physical Exam Vitals and nursing note reviewed.  Constitutional:      Appearance: She is well-developed. She is not ill-appearing.  HENT:     Head: Normocephalic and atraumatic.     Mouth/Throat:     Mouth: Mucous membranes are moist.  Cardiovascular:     Rate and  Rhythm: Normal rate and regular rhythm.     Heart sounds: Normal heart sounds.  Pulmonary:     Effort: Pulmonary effort is normal. No respiratory distress.     Breath sounds: No wheezing.  Abdominal:     General: Bowel sounds are normal.     Palpations: Abdomen is soft.     Tenderness: There is no abdominal tenderness. There is no guarding or rebound.  Musculoskeletal:     Cervical back: Neck supple.     Right lower leg: No edema.     Left lower leg: No edema.  Skin:    General: Skin is warm and dry.  Neurological:     Mental Status: She is alert and oriented to person, place, and time.  Psychiatric:        Mood and Affect: Mood normal.     ED Results / Procedures / Treatments   Labs (all labs ordered are listed, but only abnormal results are displayed) Labs Reviewed  COMPREHENSIVE METABOLIC PANEL - Abnormal; Notable for the following components:      Result Value   Potassium 3.4 (*)    Glucose, Bld 120 (*)    All other components within normal limits  LIPASE, BLOOD  CBC  URINALYSIS, ROUTINE W REFLEX MICROSCOPIC  I-STAT BETA HCG BLOOD, ED ([facility], [facility], [facility] ONLY)    EKG None  Radiology No results found.  Procedures Procedures (including critical care time)  Medications Ordered in ED Medications  sodium chloride flush (NS) 0.9 % injection 3 mL (has no administration in time range)  ondansetron (ZOFRAN-ODT) disintegrating tablet 4 mg (4 mg Oral Given 01/30/20 0331)  dicyclomine (BENTYL) capsule 10 mg (10 mg Oral Given 01/30/20 0543)  alum & mag hydroxide-simeth (MAALOX/MYLANTA) 200-200-20 MG/5ML suspension 30 mL (30 mLs Oral Given 01/30/20 0543)    And  lidocaine (XYLOCAINE) 2 % viscous mouth solution 15 mL (15 mLs Oral Given 01/30/20 0543)    ED Course  I have reviewed the triage vital signs and the nursing notes.  Pertinent labs & imaging results that were available during my care of the patient were reviewed by me and considered in my medical decision making (see  chart for details).    MDM Rules/Calculators/A&P                       Patient presents with abdominal pain.  She is overall nontoxic-appearing vital signs are reassuring.  No signs of peritonitis on exam.  She also reports nausea and onset  of diarrhea.  Abdomen is largely nontender.  Patient was initially pain-free on my evaluation.  However, pain returned.  She was given Bentyl and a GI cocktail.  She did have some improvement of her symptoms.  I have reviewed her lab work.  No significant evidence of dehydration or metabolic derangement.  No leukocytosis.  Given reassuring abdominal exam, favor viral etiology.  Doubt cholecystitis, appendicitis, pancreatitis.  Patient able to tolerate fluids.  Encouraged to continue breast-feeding.  She will be discharged with meds for supportive treatment including Zofran and Imodium.  After history, exam, and medical workup I feel the patient has been appropriately medically screened and is safe for discharge home. Pertinent diagnoses were discussed with the patient. Patient was given return precautions.   Final Clinical Impression(s) / ED Diagnoses Final diagnoses:  Nausea vomiting and diarrhea    Rx / DC Orders ED Discharge Orders         Ordered    ondansetron (ZOFRAN ODT) 4 MG disintegrating tablet  Every 8 hours PRN     01/30/20 0631    loperamide (IMODIUM) 2 MG capsule  4 times daily PRN     01/30/20 0631           Merryl Hacker, MD 01/30/20 [phone]

## 2020-01-30 NOTE — Discharge Instructions (Addendum)
You are seen today for nausea and diarrhea.  This is likely related to a viral illness.  Take medications as prescribed.  You may continue to breast-feed without interruption.  Make sure that you are staying hydrated.  If you develop worsening pain or fevers you should be reevaluated

## 2020-01-30 NOTE — ED Triage Notes (Signed)
Patient arrived with EMS from home reports pain across her abdomen with diarrhea and nausea onset this evening .
